# Patient Record
Sex: Male | Born: 1950
Health system: Southern US, Community
[De-identification: ages and names within clinical notes are randomized; demographics above are authoritative.]

## PROBLEM LIST (undated history)

## (undated) DIAGNOSIS — M1990 Unspecified osteoarthritis, unspecified site: Secondary | ICD-10-CM

## (undated) DIAGNOSIS — E785 Hyperlipidemia, unspecified: Secondary | ICD-10-CM

## (undated) DIAGNOSIS — T884XXA Failed or difficult intubation, initial encounter: Secondary | ICD-10-CM

## (undated) DIAGNOSIS — I2510 Atherosclerotic heart disease of native coronary artery without angina pectoris: Secondary | ICD-10-CM

## (undated) DIAGNOSIS — C801 Malignant (primary) neoplasm, unspecified: Secondary | ICD-10-CM

## (undated) DIAGNOSIS — I10 Essential (primary) hypertension: Secondary | ICD-10-CM

## (undated) DIAGNOSIS — R42 Dizziness and giddiness: Secondary | ICD-10-CM

## (undated) DIAGNOSIS — E119 Type 2 diabetes mellitus without complications: Secondary | ICD-10-CM

## (undated) DIAGNOSIS — J449 Chronic obstructive pulmonary disease, unspecified: Secondary | ICD-10-CM

## (undated) DIAGNOSIS — F4310 Post-traumatic stress disorder, unspecified: Secondary | ICD-10-CM

## (undated) DIAGNOSIS — M069 Rheumatoid arthritis, unspecified: Secondary | ICD-10-CM

## (undated) HISTORY — PX: KNEE SURGERY: SHX244

## (undated) HISTORY — PX: OTHER SURGICAL HISTORY: SHX169

## (undated) HISTORY — PX: BACK SURGERY: SHX140

## (undated) HISTORY — PX: ROTATOR CUFF REPAIR: SHX139

## (undated) HISTORY — PX: REPAIR OF PERFORATED ULCER: SHX6065

## (undated) HISTORY — DX: Unspecified osteoarthritis, unspecified site: M19.90

## (undated) HISTORY — DX: Chronic obstructive pulmonary disease, unspecified: J44.9

## (undated) HISTORY — DX: Essential (primary) hypertension: I10

## (undated) HISTORY — DX: Rheumatoid arthritis, unspecified: M06.9

## (undated) HISTORY — DX: Post-traumatic stress disorder, unspecified: F43.10

---

## 2017-08-18 ENCOUNTER — Encounter (HOSPITAL_COMMUNITY): Payer: Self-pay

## 2017-08-18 ENCOUNTER — Encounter (HOSPITAL_COMMUNITY)
Admission: RE | Admit: 2017-08-18 | Discharge: 2017-08-18 | Disposition: A | Discharge status: Home / Self Care | Payer: No Typology Code available for payment source | Source: Ambulatory Visit | Attending: Critical Care Medicine | Admitting: Critical Care Medicine

## 2017-08-18 VITALS — BP 120/82 | HR 84 | Ht 68.0 in | Wt 179.0 lb

## 2017-08-18 DIAGNOSIS — Z79899 Other long term (current) drug therapy: Secondary | ICD-10-CM | POA: Diagnosis not present

## 2017-08-18 DIAGNOSIS — J449 Chronic obstructive pulmonary disease, unspecified: Secondary | ICD-10-CM | POA: Diagnosis not present

## 2017-08-18 DIAGNOSIS — E785 Hyperlipidemia, unspecified: Secondary | ICD-10-CM | POA: Diagnosis not present

## 2017-08-18 DIAGNOSIS — Z7984 Long term (current) use of oral hypoglycemic drugs: Secondary | ICD-10-CM | POA: Insufficient documentation

## 2017-08-18 DIAGNOSIS — E119 Type 2 diabetes mellitus without complications: Secondary | ICD-10-CM | POA: Diagnosis not present

## 2017-08-18 HISTORY — DX: Malignant (primary) neoplasm, unspecified: C80.1

## 2017-08-18 HISTORY — DX: Hyperlipidemia, unspecified: E78.5

## 2017-08-18 HISTORY — DX: Type 2 diabetes mellitus without complications: E11.9

## 2017-08-18 NOTE — Progress Notes (Signed)
Cardiac/Pulmonary Rehab Medication Review by a Pharmacist  Does the patient  feel that his/her medications are working for him/her?  yes  Has the patient been experiencing any side effects to the medications prescribed?  no  Does the patient measure his/her own blood pressure or blood glucose at home?  yes   Does the patient have any problems obtaining medications due to transportation or finances?   no  Understanding of regimen: good Understanding of indications: good Potential of compliance: good  Questions asked to Determine Patient Understanding of Medication Regimen:  1. What is the name of the medication?  2. What is the medication used for?  3. When should it be taken?  4. How much should be taken?  5. How will you take it?  6. What side effects should you report?  Understanding Defined as: Excellent: All questions above are correct Good: Questions 1-4 are correct Fair: Questions 1-2 are correct  Poor: 1 or none of the above questions are correct   Pharmacist comments: Pt has PTSD and is here with service dog.  Pt gets meds from [place].  Pt does not c/o side effects.  Pt states he checks his blood sugar regularly but his BP checks are sparse and not regular.  Recommended he checks BP regularly.    Hart Robinsons A 08/18/2017 2:37 PM

## 2017-08-18 NOTE — Progress Notes (Signed)
Daily Session Note  Patient Details  Name: Bryan Kent MRN: [id number] Date of Birth: 06-02-1950 Referring Provider:     CARDIAC REHAB PHASE II ORIENTATION from 08/18/2017 in [facility]  Referring Provider  Dr. Rosaria Ferries      Encounter Date: 08/18/2017  Check In:   Capillary Blood Glucose: No results found for this or any previous visit (from the past 24 hour(s)).    Social History   Tobacco Use  Smoking Status Not on file    Goals Met:  Independence with exercise equipment Exercise tolerated well No report of cardiac concerns or symptoms Strength training completed today  Goals Unmet:  Not Applicable  Comments: Check out: 1500   Dr. Sinda Du is Medical Director for [facility].

## 2017-08-18 NOTE — Progress Notes (Signed)
Pulmonary Individual Treatment Plan  Patient Details  Name: Bryan Kent MRN: [id number] Date of Birth: 20-Jan-1951 Referring Provider:     CARDIAC REHAB PHASE II ORIENTATION from 08/18/2017 in [facility]  Referring Provider  Dr. Rosaria Ferries      Initial Encounter Date:    CARDIAC REHAB PHASE II ORIENTATION from 08/18/2017 in [facility]  Date  08/18/17  Referring Provider  Dr. Rosaria Ferries      Visit Diagnosis: Chronic obstructive pulmonary disease, unspecified COPD type ([place])  Patient's Home Medications on Admission:   Current Outpatient Medications:  .  folic acid (FOLVITE) 1 MG tablet, Take 1 mg by mouth daily. , Disp: , Rfl:  .  hydroxychloroquine (PLAQUENIL) 200 MG tablet, Take 200 mg by mouth daily., Disp: , Rfl:  .  methotrexate (RHEUMATREX) 2.5 MG tablet, Take 8 tablets by mouth every Saturday., Disp: , Rfl:  .  albuterol (ACCUNEB) 0.63 MG/3ML nebulizer solution, Inhale 1 puff into the lungs 2 (two) times daily as needed., Disp: , Rfl:  .  glipiZIDE (GLUCOTROL) 10 MG tablet, Take 10 mg by mouth 2 (two) times daily before a meal. , Disp: , Rfl:  .  metFORMIN (GLUCOPHAGE) 1000 MG tablet, Take 1,000 mg by mouth 2 (two) times daily with a meal., Disp: , Rfl:  .  omeprazole (PRILOSEC) 20 MG capsule, Take 20 mg by mouth daily., Disp: , Rfl:  .  pioglitazone (ACTOS) 30 MG tablet, Take 30 mg by mouth daily. , Disp: , Rfl:  .  rosuvastatin (CRESTOR) 40 MG tablet, Take 40 mg by mouth daily. , Disp: , Rfl:   Past Medical History: Past Medical History:  Diagnosis Date  . Cancer ([place])   . Diabetes mellitus without complication ([place])   . Hyperlipidemia     Tobacco Use: Social History   Tobacco Use  Smoking Status Not on file    Labs: Recent Review Flowsheet Data    There is no flowsheet data to display.      Capillary Blood Glucose: No results found for: GLUCAP   Pulmonary Assessment Scores: Pulmonary Assessment Scores    Row Name  08/18/17 1625         ADL UCSD   ADL Phase  Entry     SOB Score total  66     Rest  0     Walk  9     Stairs  5     Bath  2     Dress  2     Shop  4       CAT Score   CAT Score  32       mMRC Score   mMRC Score  4        Pulmonary Function Assessment: Pulmonary Function Assessment - 08/18/17 1627      Pulmonary Function Tests   FVC%  70 %    FEV1%  47 %    FEV1/FVC Ratio  46    RV%  175 %    DLCO%  55 %      Initial Spirometry Results   FVC%  70 %    FEV1%  47 %    FEV1/FVC Ratio  46      Post Bronchodilator Spirometry Results   FVC%  83 %    FEV1%  53 %    FEV1/FVC Ratio  44      Breath   Bilateral Breath Sounds  Clear    Shortness of Breath  Yes       Exercise Target Goals: Date: 08/18/17  Exercise Program Goal: Individual exercise prescription set using results from initial 6 min walk test and THRR while considering  patient's activity barriers and safety.   Exercise Prescription Goal: Initial exercise prescription builds to 30-45 minutes a day of aerobic activity, 2-3 days per week.  Home exercise guidelines will be given to patient during program as part of exercise prescription that the participant will acknowledge.  Activity Barriers & Risk Stratification: Activity Barriers & Cardiac Risk Stratification - 08/18/17 1442      Activity Barriers & Cardiac Risk Stratification   Activity Barriers  Other (comment);Back Problems;Shortness of Breath;History of Falls    Comments  Service dog for PTSD    Cardiac Risk Stratification  Moderate       6 Minute Walk: 6 Minute Walk    Row Name 08/18/17 1441         6 Minute Walk   Phase  Initial     Distance  1300 feet     Distance % Change  0 %     Distance Feet Change  0 ft     Walk Time  6 minutes     # of Rest Breaks  0     MPH  2.46     METS  2.88     RPE  13     Perceived Dyspnea   11     VO2 Peak  10.25     Symptoms  No     Resting HR  84 bpm     Resting BP  120/82     Resting  Oxygen Saturation   95 %     Exercise Oxygen Saturation  during 6 min walk  93 %     Max Ex. HR  115 bpm     Max Ex. BP  144/74     2 Minute Post BP  124/74        Oxygen Initial Assessment: Oxygen Initial Assessment - 08/18/17 1615      Home Oxygen   Home Oxygen Device  None    Sleep Oxygen Prescription  None    Home Exercise Oxygen Prescription  None    Home at Rest Exercise Oxygen Prescription  None    Compliance with Home Oxygen Use  -- None needed      Initial 6 min Walk   Oxygen Used  None      Program Oxygen Prescription   Program Oxygen Prescription  None       Oxygen Re-Evaluation:   Oxygen Discharge (Final Oxygen Re-Evaluation):   Initial Exercise Prescription: Initial Exercise Prescription - 08/18/17 1400      Date of Initial Exercise RX and Referring Provider   Date  08/18/17    Referring Provider  Dr. Rosaria Ferries      NuStep   Level  1    SPM  70    Minutes  15    METs  1.7      Arm Ergometer   Level  2    Watts  25    RPM  25    Minutes  20    METs  2.9      Prescription Details   Frequency (times per week)  3    Duration  Progress to 30 minutes of continuous aerobic without signs/symptoms of physical distress      Intensity   THRR 40-80% of Max Heartrate  [id number]  Ratings of Perceived Exertion  11-13    Perceived Dyspnea  0-4      Progression   Progression  Continue progressive overload as per policy without signs/symptoms or physical distress.      Resistance Training   Training Prescription  Yes    Weight  1    Reps  10-15       Perform Capillary Blood Glucose checks as needed.  Exercise Prescription Changes:   Exercise Comments:   Exercise Goals and Review:  Exercise Goals    Row Name 08/18/17 1444             Exercise Goals   Increase Physical Activity  Yes       Intervention  Provide advice, education, support and counseling about physical activity/exercise needs.;Develop an individualized exercise  prescription for aerobic and resistive training based on initial evaluation findings, risk stratification, comorbidities and participant's personal goals.       Expected Outcomes  Short Term: Attend rehab on a regular basis to increase amount of physical activity.       Increase Strength and Stamina  Yes       Intervention  Provide advice, education, support and counseling about physical activity/exercise needs.;Develop an individualized exercise prescription for aerobic and resistive training based on initial evaluation findings, risk stratification, comorbidities and participant's personal goals.       Expected Outcomes  Short Term: Increase workloads from initial exercise prescription for resistance, speed, and METs.       Able to understand and use rate of perceived exertion (RPE) scale  Yes       Intervention  Provide education and explanation on how to use RPE scale       Expected Outcomes  Short Term: Able to use RPE daily in rehab to express subjective intensity level;Long Term:  Able to use RPE to guide intensity level when exercising independently       Able to understand and use Dyspnea scale  Yes       Intervention  Provide education and explanation on how to use Dyspnea scale       Expected Outcomes  Short Term: Able to use Dyspnea scale daily in rehab to express subjective sense of shortness of breath during exertion;Long Term: Able to use Dyspnea scale to guide intensity level when exercising independently       Knowledge and understanding of Target Heart Rate Range (THRR)  Yes       Intervention  Provide education and explanation of THRR including how the numbers were predicted and where they are located for reference       Expected Outcomes  Short Term: Able to state/look up THRR;Short Term: Able to use daily as guideline for intensity in rehab;Long Term: Able to use THRR to govern intensity when exercising independently       Able to check pulse independently  Yes       Intervention   Provide education and demonstration on how to check pulse in carotid and radial arteries.;Review the importance of being able to check your own pulse for safety during independent exercise       Expected Outcomes  Short Term: Able to explain why pulse checking is important during independent exercise;Long Term: Able to check pulse independently and accurately       Understanding of Exercise Prescription  Yes       Intervention  Provide education, explanation, and written materials on patient's individual exercise prescription  Expected Outcomes  Short Term: Able to explain program exercise prescription;Long Term: Able to explain home exercise prescription to exercise independently          Exercise Goals Re-Evaluation :   Discharge Exercise Prescription (Final Exercise Prescription Changes):   Nutrition:  Target Goals: Understanding of nutrition guidelines, daily intake of sodium 1500mg , cholesterol 200mg , calories 30% from fat and 7% or less from saturated fats, daily to have 5 or more servings of fruits and vegetables.  Biometrics: Pre Biometrics - 08/18/17 1445      Pre Biometrics   Height  5\' 8"  (1.727 m)    Weight  179 lb (81.2 kg)    Waist Circumference  41 inches    Hip Circumference  38 inches    Waist to Hip Ratio  1.08 %    BMI (Calculated)  27.22    Triceps Skinfold  8 mm    % Body Fat  25.3 %    Grip Strength  53.46 kg    Flexibility  0 in    Single Leg Stand  31 seconds        Nutrition Therapy Plan and Nutrition Goals: Nutrition Therapy & Goals - 08/18/17 1616      Personal Nutrition Goals   Personal Goal #2  Eats a regular diet.     Additional Goals?  No       Nutrition Assessments: Nutrition Assessments - 08/18/17 1617      MEDFICTS Scores   Pre Score  7       Nutrition Goals Re-Evaluation:   Nutrition Goals Discharge (Final Nutrition Goals Re-Evaluation):   Psychosocial: Target Goals: Acknowledge presence or absence of significant  depression and/or stress, maximize coping skills, provide positive support system. Participant is able to verbalize types and ability to use techniques and skills needed for reducing stress and depression.  Initial Review & Psychosocial Screening: Initial Psych Review & Screening - 08/18/17 1620      Initial Review   Current issues with  Current Anxiety/Panic Patient has PTSD      Family Dynamics   Good Support System?  Yes      Barriers   Psychosocial barriers to participate in program  Psychosocial barriers identified (see note)      Screening Interventions   Interventions  Encouraged to exercise    Expected Outcomes  Short Term goal: Utilizing psychosocial counselor, staff and physician to assist with identification of specific Stressors or current issues interfering with healing process. Setting desired goal for each stressor or current issue identified.;Long Term Goal: Stressors or current issues are controlled or eliminated. Patient has counselors at the [place]. Patient refused to do our [place] [facility] [place].        Quality of Life Scores: Quality of Life - 08/18/17 1446      Quality of Life Scores   Health/Function Pre  --  (Significant)  Patient refused to answer QOL sheet      Scores of 19 and below usually indicate a poorer quality of life in these areas.  A difference of  2-3 points is a clinically meaningful difference.  A difference of 2-3 points in the total score of the Quality of Life Index has been associated with significant improvement in overall quality of life, self-image, physical symptoms, and general health in studies assessing change in quality of life.   PHQ-9: Recent Review Flowsheet Data    Depression screen [facility] 2/9 08/18/2017   Decreased Interest 0   Down, Depressed,  Hopeless 0   PHQ - 2 Score 0   Altered sleeping 0   Tired, decreased energy 0   Change in appetite 0   Feeling bad or failure about yourself  0   Trouble concentrating 0   Moving slowly  or fidgety/restless 0   Suicidal thoughts 0   PHQ-9 Score 0   Difficult doing work/chores Not difficult at all     Interpretation of Total Score  Total Score Depression Severity:  1-4 = Minimal depression, 5-9 = Mild depression, 10-14 = Moderate depression, 15-19 = Moderately severe depression, 20-27 = Severe depression   Psychosocial Evaluation and Intervention: Psychosocial Evaluation - 08/18/17 1623      Psychosocial Evaluation & Interventions   Interventions  Encouraged to exercise with the program and follow exercise prescription    Comments  Patient has PTSD and has a service dog to aid in that.     Continue Psychosocial Services   Follow up required by staff       Psychosocial Re-Evaluation:   Psychosocial Discharge (Final Psychosocial Re-Evaluation):    Education: Education Goals: Education classes will be provided on a weekly basis, covering required topics. Participant will state understanding/return demonstration of topics presented.  Learning Barriers/Preferences: Learning Barriers/Preferences - 08/18/17 1456      Learning Barriers/Preferences   Learning Barriers  None    Learning Preferences  Skilled Demonstration;Group Instruction;Individual Instruction       Education Topics: How Lungs Work and Diseases: - Discuss the anatomy of the lungs and diseases that can affect the lungs, such as COPD.   Exercise: -Discuss the importance of exercise, FITT principles of exercise, normal and abnormal responses to exercise, and how to exercise safely.   Environmental Irritants: -Discuss types of environmental irritants and how to limit exposure to environmental irritants.   Meds/Inhalers and oxygen: - Discuss respiratory medications, definition of an inhaler and oxygen, and the proper way to use an inhaler and oxygen.   Energy Saving Techniques: - Discuss methods to conserve energy and decrease shortness of breath when performing activities of daily living.     Bronchial Hygiene / Breathing Techniques: - Discuss breathing mechanics, pursed-lip breathing technique,  proper posture, effective ways to clear airways, and other functional breathing techniques   Cleaning Equipment: - Provides group verbal and written instruction about the health risks of elevated stress, cause of high stress, and healthy ways to reduce stress.   Nutrition I: Fats: - Discuss the types of cholesterol, what cholesterol does to the body, and how cholesterol levels can be controlled.   Nutrition II: Labels: -Discuss the different components of food labels and how to read food labels.   Respiratory Infections: - Discuss the signs and symptoms of respiratory infections, ways to prevent respiratory infections, and the importance of seeking medical treatment when having a respiratory infection.   Stress I: Signs and Symptoms: - Discuss the causes of stress, how stress may lead to anxiety and depression, and ways to limit stress.   Stress II: Relaxation: -Discuss relaxation techniques to limit stress.   Oxygen for Home/Travel: - Discuss how to prepare for travel when on oxygen and proper ways to transport and store oxygen to ensure safety.   Knowledge Questionnaire Score: Knowledge Questionnaire Score - 08/18/17 1611      Knowledge Questionnaire Score   Pre Score  13/18       Core Components/Risk Factors/Patient Goals at Admission: Personal Goals and Risk Factors at Admission - 08/18/17 1617  Core Components/Risk Factors/Patient Goals on Admission    Weight Management  Weight Maintenance    Improve shortness of breath with ADL's  Yes    Intervention  Provide education, individualized exercise plan and daily activity instruction to help decrease symptoms of SOB with activities of daily living.    Expected Outcomes  Short Term: Improve cardiorespiratory fitness to achieve a reduction of symptoms when performing ADLs;Long Term: Be able to perform more  ADLs without symptoms or delay the onset of symptoms    Personal Goal Other  Yes    Personal Goal  Have more endurance, Gain muscle tone    Intervention  Attend PR 2 x week and supplement at home 3 x week.    Expected Outcomes  Reach personal goal.        Core Components/Risk Factors/Patient Goals Review:    Core Components/Risk Factors/Patient Goals at Discharge (Final Review):    ITP Comments: ITP Comments    Row Name 08/18/17 1613           ITP Comments  Mr. Dewan is a veteran with COPD. He is being sent to [place] by the [place]. He has a service dog due to his PTSD. He will be able to bring his dog with him. The dog is trained and sit by Mr. Tetrault.           Comments: Patient arrived for 1st visit/orientation/education at 1230. Patient was referred to PR by Dr. Rosaria Ferries at the [facility]. Due to COPD (J44.9). During orientation advised patient on arrival and appointment times what to wear, what to do before, during and after exercise. Reviewed attendance and class policy. Talked about inclement weather and class consultation policy. Pt is scheduled to return Cardiac Rehab on 08/23/17 at 1330. Pt was advised to come to class 15 minutes before class starts. Patient was also given instructions on meeting with the dietician and attending the Family Structure classes. Discussed RPE/Dpysnea scales. Discussed initial THR and how to find their radial and/or carotid pulse. Discussed the initial exercise prescription and how this effects their progress. Pt is eager to get started. Patient participated in warm up stretches followed by light weights and resistance bands. Patient was able to complete 6 minute walk test. Patient did not c/o pain. Patient was measured for the equipment. Discussed equipment safety with patient. Took patient pre-anthropometric measurements. Patient finished visit at 1500.

## 2017-08-23 ENCOUNTER — Encounter (HOSPITAL_COMMUNITY): Payer: No Typology Code available for payment source

## 2017-08-25 ENCOUNTER — Encounter (HOSPITAL_COMMUNITY): Payer: No Typology Code available for payment source

## 2017-08-30 ENCOUNTER — Encounter (HOSPITAL_COMMUNITY): Payer: No Typology Code available for payment source

## 2017-09-01 ENCOUNTER — Encounter (HOSPITAL_COMMUNITY): Payer: No Typology Code available for payment source

## 2017-09-06 ENCOUNTER — Encounter (HOSPITAL_COMMUNITY): Payer: No Typology Code available for payment source

## 2017-09-08 ENCOUNTER — Encounter (HOSPITAL_COMMUNITY): Payer: No Typology Code available for payment source

## 2017-09-13 ENCOUNTER — Encounter (HOSPITAL_COMMUNITY): Payer: No Typology Code available for payment source

## 2017-09-15 ENCOUNTER — Encounter (HOSPITAL_COMMUNITY): Payer: No Typology Code available for payment source

## 2017-09-20 ENCOUNTER — Encounter (HOSPITAL_COMMUNITY): Payer: Non-veteran care

## 2017-09-22 ENCOUNTER — Encounter (HOSPITAL_COMMUNITY): Payer: Non-veteran care

## 2017-09-27 ENCOUNTER — Encounter (HOSPITAL_COMMUNITY): Payer: Non-veteran care

## 2017-09-29 ENCOUNTER — Encounter (HOSPITAL_COMMUNITY): Payer: Non-veteran care

## 2017-10-04 ENCOUNTER — Encounter (HOSPITAL_COMMUNITY): Payer: Non-veteran care

## 2017-10-06 ENCOUNTER — Encounter (HOSPITAL_COMMUNITY): Payer: Non-veteran care

## 2017-10-11 ENCOUNTER — Encounter (HOSPITAL_COMMUNITY): Payer: Non-veteran care

## 2017-10-13 ENCOUNTER — Encounter (HOSPITAL_COMMUNITY): Payer: Non-veteran care

## 2017-10-18 ENCOUNTER — Encounter (HOSPITAL_COMMUNITY): Payer: No Typology Code available for payment source

## 2017-10-20 ENCOUNTER — Encounter (HOSPITAL_COMMUNITY): Payer: No Typology Code available for payment source

## 2017-10-25 ENCOUNTER — Encounter (HOSPITAL_COMMUNITY): Payer: No Typology Code available for payment source

## 2017-10-27 ENCOUNTER — Encounter (HOSPITAL_COMMUNITY): Payer: No Typology Code available for payment source

## 2017-11-01 ENCOUNTER — Encounter (HOSPITAL_COMMUNITY): Payer: No Typology Code available for payment source

## 2017-11-03 ENCOUNTER — Encounter (HOSPITAL_COMMUNITY): Payer: No Typology Code available for payment source

## 2017-11-08 ENCOUNTER — Encounter (HOSPITAL_COMMUNITY): Payer: No Typology Code available for payment source

## 2017-11-10 ENCOUNTER — Encounter (HOSPITAL_COMMUNITY): Payer: No Typology Code available for payment source

## 2017-11-15 ENCOUNTER — Encounter (HOSPITAL_COMMUNITY): Payer: No Typology Code available for payment source

## 2017-11-17 ENCOUNTER — Encounter (HOSPITAL_COMMUNITY): Payer: No Typology Code available for payment source

## 2017-11-22 ENCOUNTER — Encounter (HOSPITAL_COMMUNITY): Payer: No Typology Code available for payment source

## 2017-11-24 ENCOUNTER — Encounter (HOSPITAL_COMMUNITY): Payer: No Typology Code available for payment source

## 2017-11-29 ENCOUNTER — Encounter (HOSPITAL_COMMUNITY): Payer: No Typology Code available for payment source

## 2017-12-01 ENCOUNTER — Encounter (HOSPITAL_COMMUNITY): Payer: No Typology Code available for payment source

## 2017-12-06 ENCOUNTER — Encounter (HOSPITAL_COMMUNITY): Payer: No Typology Code available for payment source

## 2017-12-08 ENCOUNTER — Encounter (HOSPITAL_COMMUNITY): Payer: No Typology Code available for payment source

## 2017-12-13 ENCOUNTER — Encounter (HOSPITAL_COMMUNITY): Payer: No Typology Code available for payment source

## 2017-12-15 ENCOUNTER — Encounter (HOSPITAL_COMMUNITY): Payer: No Typology Code available for payment source

## 2017-12-20 ENCOUNTER — Encounter (HOSPITAL_COMMUNITY): Payer: No Typology Code available for payment source

## 2017-12-22 ENCOUNTER — Encounter (HOSPITAL_COMMUNITY): Payer: No Typology Code available for payment source

## 2018-12-07 ENCOUNTER — Ambulatory Visit (INDEPENDENT_AMBULATORY_CARE_PROVIDER_SITE_OTHER): Payer: No Typology Code available for payment source

## 2018-12-07 ENCOUNTER — Other Ambulatory Visit: Payer: Self-pay | Admitting: *Deleted

## 2018-12-07 ENCOUNTER — Ambulatory Visit (INDEPENDENT_AMBULATORY_CARE_PROVIDER_SITE_OTHER): Payer: No Typology Code available for payment source | Admitting: Orthopaedic Surgery

## 2018-12-07 ENCOUNTER — Encounter: Payer: Self-pay | Admitting: Orthopaedic Surgery

## 2018-12-07 ENCOUNTER — Other Ambulatory Visit: Payer: Self-pay

## 2018-12-07 VITALS — Ht 68.0 in | Wt 180.0 lb

## 2018-12-07 DIAGNOSIS — G8929 Other chronic pain: Secondary | ICD-10-CM | POA: Diagnosis not present

## 2018-12-07 DIAGNOSIS — M25511 Pain in right shoulder: Secondary | ICD-10-CM

## 2018-12-07 NOTE — Progress Notes (Signed)
Office Visit Note   Patient: Bryan Kent           Date of Birth: 1950/10/14           MRN: [id number] Visit Date: 12/07/2018              Requested by: Kirke Corin, [place],  [place] [address] PCP: Patient, No Pcp Per   Assessment & Plan: Visit Diagnoses:  1. 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  Will order a thin slice CT arthrogram to evaluate the integrity of the cuff and potentially prepare for reverse shoulder arthroplasty. long discussion with Mr. Snapp regarding potential diagnosis and treatment options.  He is concerned about cortisone injection because of his diabetes.  In the past he has had elevation of his blood sugar with cortisone.  He is sponsored by the [place]. prior rotator cuff tear repair in 2014 in [place]. Office visit over 45 minutes 50% of the time in counseling  Follow-Up Instructions: Return After a thin slice CT arthrogram right shoulder.   Orders:  Orders Placed This Encounter  Procedures  . XR Shoulder Right   No orders of the defined types were placed in this encounter.     Procedures: No procedures performed   Clinical Data: No additional findings.   Subjective: Chief Complaint  Patient presents with  . Right Shoulder - Pain  Patient presents today for right shoulder pain. His shoulder has been hurting for two weeks with no known injury that he is aware of. His pain is located within the joint and radiates down into his elbow. He has limited range of motion. He does have some weakness in his arm. He is only taking aspirin as needed for pain. He takes prednisone for arthritis flare ups. He is right hand dominant. He has a history of a rotator cuff surgery repair in 2014. Mr. Maclellan is sponsored by the [place].  He is recently been evaluated and referred to [place] for further evaluation of his shoulder pain.  He did have rotator cuff surgery in [place] in 2014 and  did well up until the last 4 to 6 months when he would experience insidious onset of pain.  Now he is having difficulty raising his arm over his head.  Has had trouble sleeping on that side.  He is having quite a bit of pain as well as weakness.  He does not have any issues with range of motion of the cervical spine.  He does have a history of COPD and uses inhalers.  He is a type II diabetic and has had issues with elevated blood glucose with prior cortisone injections.  He also has a service dog that accompanies him  HPI  Review of Systems  Constitutional: Negative for fatigue.  HENT: Negative for ear pain.   Eyes: Negative for pain.  Respiratory: Negative for shortness of breath.   Cardiovascular: Negative for leg swelling.  Gastrointestinal: Negative for constipation and diarrhea.  Endocrine: Negative for cold intolerance and heat intolerance.  Genitourinary: Negative for difficulty urinating.  Musculoskeletal: Negative for joint swelling.  Skin: Negative for rash.  Allergic/Immunologic: Positive for food allergies.  Neurological: Positive for weakness.  Hematological: Does not bruise/bleed easily.  Psychiatric/Behavioral: Positive for sleep disturbance.     Objective: Vital Signs: Ht 5\' 8"  (1.727 m)   Wt 180 lb (81.6 kg)   BMI 27.37 kg/m   Physical Exam  Constitutional:      Appearance: He is well-developed.  Eyes:     Pupils: Pupils are equal, round, and reactive to light.  Pulmonary:     Effort: Pulmonary effort is normal.  Skin:    General: Skin is warm and dry.  Neurological:     Mental Status: He is alert and oriented to person, place, and time.  Psychiatric:        Behavior: Behavior normal.     Ortho Exam no shortness of breath or chest pain.  Had taken an inhaler prior to coming to the office. With passive motion he still lacked about 40 degrees of full overhead motion.  I could abduct about 90 degrees at which point he had some pain.  No crepitation.  Atrophy  diffusely about the right shoulder compared to the left.  Biceps appeared to be intact.  Skin intact.  Some discomfort along the anterior and lateral subacromial region.  Considerable weakness with external rotation.  Good strength with internal rotation.  No pain with range of motion of cervical spine .positive empty can and impingement testing.  Good grip and release  Specialty Comments:  No specialty comments available.  Imaging: No results found.   PMFS History: Patient Active Problem List   Diagnosis Date Noted  . Pain in right shoulder 12/07/2018   Past Medical History:  Diagnosis Date  . Cancer ([place])   . Diabetes mellitus without complication ([place])   . Hyperlipidemia     Family History  Problem Relation Age of Onset  . Heart disease Father     History reviewed. No pertinent surgical history. Social History   Occupational History  . Occupation: Retired  Tobacco Use  . Smoking status: Never Smoker  . Smokeless tobacco: Never Used  Substance and Sexual Activity  . Alcohol use: Not Currently  . Drug use: Never  . Sexual activity: Not on file

## 2018-12-12 ENCOUNTER — Telehealth: Payer: Self-pay | Admitting: Orthopaedic Surgery

## 2018-12-12 NOTE — Telephone Encounter (Signed)
12/07/18 ov note faxed to Lorel [facility] [phone], ph [phone]

## 2018-12-19 ENCOUNTER — Telehealth: Payer: Self-pay | Admitting: Orthopaedic Surgery

## 2018-12-19 NOTE — Telephone Encounter (Signed)
Patient called stating his MRI at [facility] was cancelled due to not accepting VA and only having Medicare Part A.  Patient is requesting a return call ASAP to discuss what to do next.

## 2018-12-19 NOTE — Telephone Encounter (Signed)
Can you help? Im not sure what to tell the patient.

## 2018-12-20 ENCOUNTER — Telehealth: Payer: Self-pay | Admitting: Orthopaedic Surgery

## 2018-12-20 ENCOUNTER — Other Ambulatory Visit: Payer: Self-pay | Admitting: Orthopaedic Surgery

## 2018-12-20 DIAGNOSIS — G8929 Other chronic pain: Secondary | ICD-10-CM

## 2018-12-20 NOTE — Telephone Encounter (Signed)
Spoke with patient. He has an appointment on Sept 21st at [place] for his CT scan at [facility]. I have entered his lab orders and he knows to get those done prior to his scan.

## 2018-12-20 NOTE — Telephone Encounter (Signed)
Patient left a voicemail stating he left a message yesterday regarding his MRI that was cancelled.

## 2018-12-22 ENCOUNTER — Other Ambulatory Visit (HOSPITAL_COMMUNITY)
Admission: RE | Admit: 2018-12-22 | Discharge: 2018-12-22 | Disposition: A | Discharge status: Home / Self Care | Payer: No Typology Code available for payment source | Source: Ambulatory Visit | Attending: Orthopaedic Surgery | Admitting: Orthopaedic Surgery

## 2018-12-22 DIAGNOSIS — Z01812 Encounter for preprocedural laboratory examination: Secondary | ICD-10-CM | POA: Diagnosis present

## 2018-12-22 LAB — CREATININE, SERUM
Creatinine, Ser: 0.95 mg/dL (ref 0.61–1.24)
GFR calc Af Amer: >60 mL/min (ref >60)
GFR calc non Af Amer: >60 mL/min (ref >60)

## 2018-12-22 LAB — BRAIN NATRIURETIC PEPTIDE: B Natriuretic Peptide: 44 pg/mL (ref 0.0–100.0)

## 2019-01-08 ENCOUNTER — Other Ambulatory Visit: Payer: Self-pay

## 2019-01-08 ENCOUNTER — Ambulatory Visit (HOSPITAL_COMMUNITY)
Admission: RE | Admit: 2019-01-08 | Discharge: 2019-01-08 | Disposition: A | Discharge status: Home / Self Care | Payer: No Typology Code available for payment source | Source: Ambulatory Visit | Attending: Orthopaedic Surgery | Admitting: Orthopaedic Surgery

## 2019-01-08 DIAGNOSIS — G8929 Other chronic pain: Secondary | ICD-10-CM

## 2019-01-08 DIAGNOSIS — M25511 Pain in right shoulder: Secondary | ICD-10-CM | POA: Insufficient documentation

## 2019-01-08 DIAGNOSIS — M62511 Muscle wasting and atrophy, not elsewhere classified, right shoulder: Secondary | ICD-10-CM | POA: Diagnosis not present

## 2019-01-08 DIAGNOSIS — M19011 Primary osteoarthritis, right shoulder: Secondary | ICD-10-CM | POA: Insufficient documentation

## 2019-01-08 DIAGNOSIS — M75121 Complete rotator cuff tear or rupture of right shoulder, not specified as traumatic: Secondary | ICD-10-CM | POA: Diagnosis not present

## 2019-01-08 IMAGING — RF DG FLUORO GUIDE NDL PLC/BX
1 series · 1 of 1 positions shown · non-contrast
Comparison: none

CLINICAL DATA: Chronic RIGHT shoulder pain, prior rotator cuff
repair

[Series 1: cp_standard · 0.19mm/px · 1 of 1 slices shown]
[im 1/1]
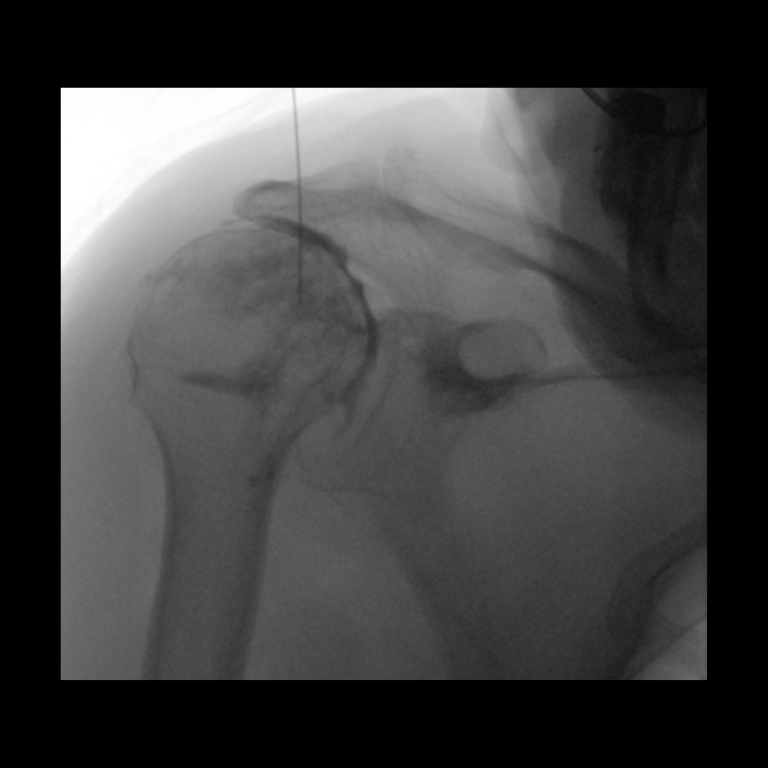

[1 of 1 positions shown; findings below may reference images not displayed]

EXAM:
RIGHT SHOULDER INJECTION UNDER FLUOROSCOPY

FLUOROSCOPY TIME:  Fluoroscopy Time:  1 minutes 12 seconds

Radiation Exposure Index (if provided by the fluoroscopic device):
4.1 mGy

Number of Acquired Spot Images: 1

PROCEDURE:
Procedure, risks, benefits and alternatives explained to the
patient.

Patient's questions answered.

Written informed consent obtained.

Timeout protocol followed.

RIGHT shoulder joint localized by fluoroscopy.

Skin prepped and draped in usual sterile fashion.

Skin and soft tissues anesthetized with 4 mL of 1% lidocaine.

Under fluoroscopic guidance, 22 gauge spinal needle was advanced
into RIGHT shoulder joint.

10 mL of a solution consisting of 5 mL Isovue 310 mL sterile saline
injected into RIGHT shoulder joint without difficulty.

Procedure tolerated well by patient without immediate complication.
IMPRESSION: Technically successful RIGHT shoulder injection under fluoroscopy.

## 2019-01-08 IMAGING — CT CT SHOULDER*R* W/CM
2 of 10 series · 7 of 34 positions shown, 8 images · non-contrast
Comparison: Plain films right shoulder [DATE]. Image from
contrast injection also reviewed.

CLINICAL DATA: Chronic right shoulder pain. History of prior
rotator cuff tear.

EXAM:
CT ARTHROGRAPHY OF THE RIGHT SHOULDER
TECHNIQUE: Multidetector CT imaging was performed following the standard
protocol after injection of dilute contrast into the joint.

[Series 10: ax aber thin bone · axial · 0.57mm/px · z∈[+185,+279]mm · 2 of 501 slices shown, 3 images]
[im 167/501  soft-tissue]
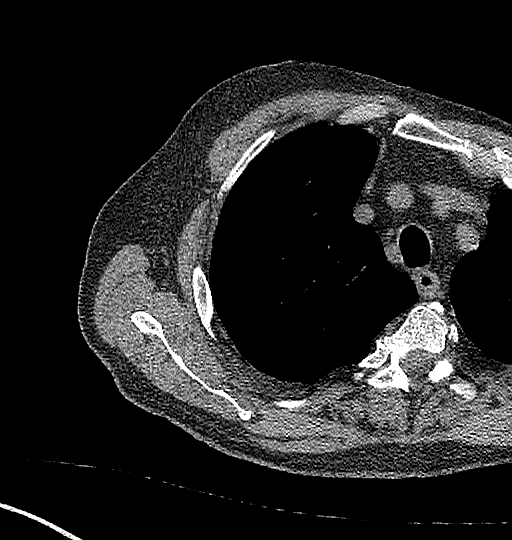
[im 167/501  bone]
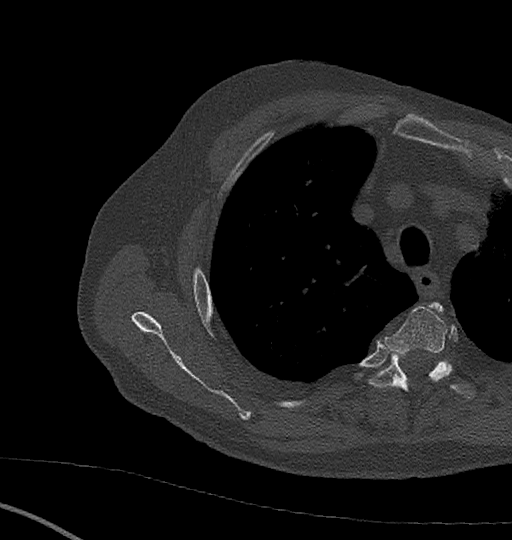
[im 334/501  bone]
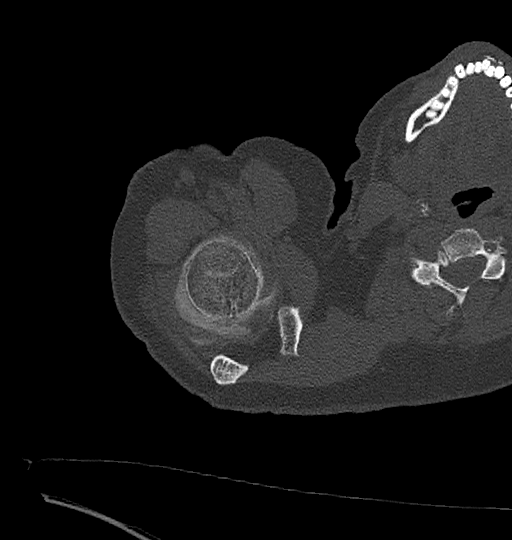

[Series 25: aber sag bone · sagittal · 0.60mm/px · 5 of 467 slices shown]
[im 66/467  bone]
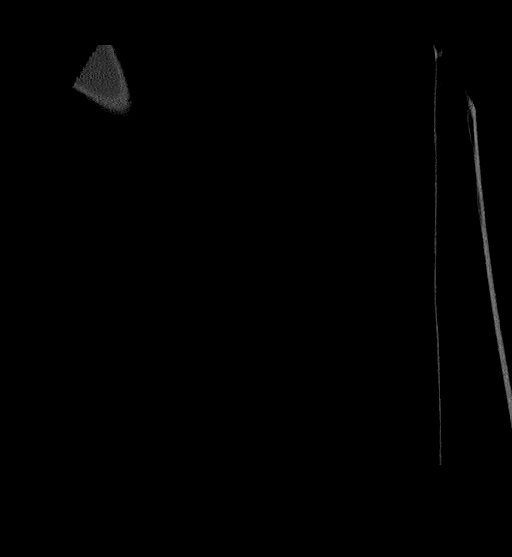
[im 69/467  soft-tissue]
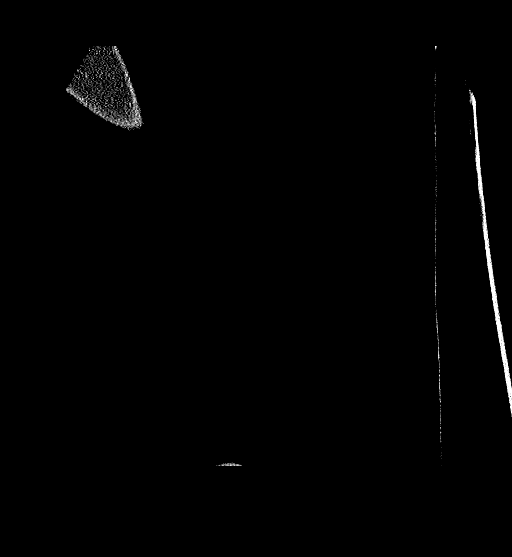
[im 131/467  bone]
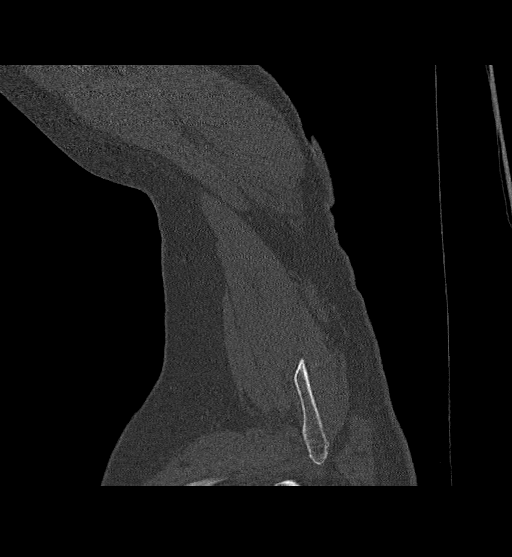
[im 196/467  bone]
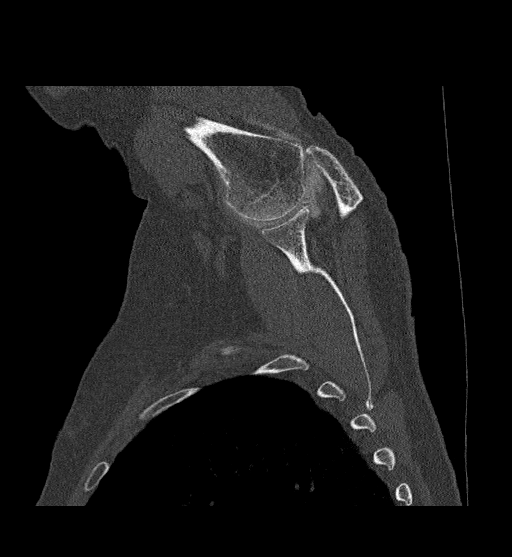
[im 262/467  bone]
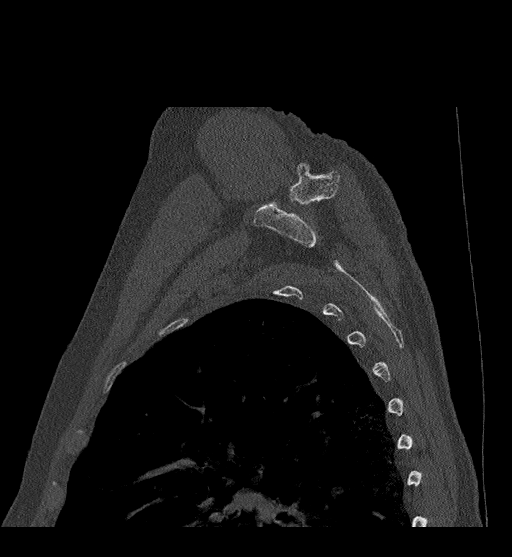

[7 of 34 positions shown; findings below may reference images not displayed]

FINDINGS: Rotator cuff: Anchor related to prior rotator cuff repair is noted.
The patient has complete supraspinatus and near-complete
infraspinatus tendon tears with retraction to the glenoid, 4-5 cm.
Only a small band of the posterior most infraspinatus tendon is
intact. The subscapularis is intact.

Muscles: Mild supraspinatus, infraspinatus and subscapularis atrophy
is most notable in the supraspinatus.

Biceps long head: Somewhat difficult to visualize. The
intra-articular segment appears attenuated but intact.

Acromioclavicular Joint: Moderate osteoarthritis. Type 1 acromion.
There is some subacromial spurring. Contrast extravasates into the
subacromial/subdeltoid bursa.

Glenohumeral Joint: Mild degenerative change. With some cartilage
thinning noted

Labrum: The superior labrum is blunted. Contrast undermines the
posterior, superior labrum consistent with a tear.

Bones:  No fracture or worrisome lesion.

Other: Imaged lung parenchyma is emphysematous but clear.
IMPRESSION: Status post rotator cuff repair. The patient has complete
supraspinatus and near-complete infraspinatus tendon tears with 4-5
cm of retraction and mild atrophy of both muscle bellies, more
notable in the supraspinatus.

The long head of biceps is difficult to visualize but appears intact
with attenuation of the intra-articular segment identified.

Moderate acromioclavicular osteoarthritis. Mild glenohumeral
osteoarthritis also noted.

Tear of the posterior, superior labrum.

## 2019-01-08 MED ORDER — IOHEXOL 300 MG/ML  SOLN
5.0000 mL | Freq: Once | INTRAMUSCULAR | Status: AC | PRN
  Administered 2019-01-08: 5 mL

## 2019-01-08 MED ORDER — POVIDONE-IODINE 10 % EX SOLN
CUTANEOUS | Status: AC
  Administered 2019-01-08: 11:00:00
  Filled 2019-01-08: qty 15

## 2019-01-08 MED ORDER — LIDOCAINE HCL (PF) 1 % IJ SOLN
INTRAMUSCULAR | Status: AC
  Administered 2019-01-08: 5 mL
  Filled 2019-01-08: qty 5

## 2019-01-08 MED ORDER — SODIUM CHLORIDE (PF) 0.9 % IJ SOLN
INTRAMUSCULAR | Status: AC
  Administered 2019-01-08: 10 mL
  Filled 2019-01-08: qty 10

## 2019-01-08 NOTE — Procedures (Signed)
Preprocedure Dx: Chronic right shoulder pain Postprocedure Dx: Chronic right shoulder pain Procedure  Fluoroscopically guided RIGHT shoulder joint injection for CT arthrogrpahy Radiologist:  Thornton Papas Anesthesia:  4 ml of 1% lidocaine Injectate:  10 ml of (5 ml Isovue-300 and 10 ml sterile saline) Fluoro time:  1 minutes 12 seconds EBL:   None Complications: None

## 2019-01-10 ENCOUNTER — Encounter: Payer: Self-pay | Admitting: Orthopaedic Surgery

## 2019-01-10 ENCOUNTER — Ambulatory Visit (INDEPENDENT_AMBULATORY_CARE_PROVIDER_SITE_OTHER): Payer: No Typology Code available for payment source | Admitting: Orthopaedic Surgery

## 2019-01-10 ENCOUNTER — Other Ambulatory Visit: Payer: Self-pay

## 2019-01-10 VITALS — Ht 68.0 in | Wt 181.0 lb

## 2019-01-10 DIAGNOSIS — G8929 Other chronic pain: Secondary | ICD-10-CM

## 2019-01-10 DIAGNOSIS — M25511 Pain in right shoulder: Secondary | ICD-10-CM | POA: Diagnosis not present

## 2019-01-10 MED ORDER — HYDROCODONE-ACETAMINOPHEN 5-325 MG PO TABS: 1.0000 | ORAL_TABLET | Freq: Every evening | ORAL | 0 refills | Status: DC | PRN

## 2019-01-10 NOTE — Addendum Note (Signed)
Addended by: Lendon Collar on: 01/10/2019 11:35 AM   Modules accepted: Orders

## 2019-01-10 NOTE — Progress Notes (Signed)
Office Visit Note   Patient: Bryan Kent           Date of Birth: [birth year]           MRN: [id number] Visit Date: 01/10/2019              Requested by: No referring provider defined for this encounter. PCP: Clinic, [place]: Visit Diagnoses:  1. Chronic right shoulder pain     Plan: Long discussion with Mr. Mastrogiacomo regarding his CT scan.  The scan did demonstrate complete tear of the infra and supraspinatus with retraction to the glenoid i.e. over 4 to 5 cm.  He does have some early arthritis of the glenohumeral joint.  I do not think he would be a candidate for rotator cuff tear repair given the atrophy and the retraction.  I do think he would be a candidate for reverse shoulder arthroplasty.  Have discussed this in some detail with him and will refer him to Dr. Marlou Sa.  Hydrocodone for pain.  Office visit over 30 minutes 50% of the time in counseling  Follow-Up Instructions: Return Will refer to Dr. Marlou Sa for consideration of a reverse shoulder arthroplasty.   Orders:  No orders of the defined types were placed in this encounter.  Meds ordered this encounter  Medications  . HYDROcodone-acetaminophen (NORCO/VICODIN) 5-325 MG tablet    Sig: Take 1 tablet by mouth at bedtime as needed for moderate pain.    Dispense:  30 tablet    Refill:  0      Procedures: No procedures performed   Clinical Data: No additional findings.   Subjective: Chief Complaint  Patient presents with  . Right Shoulder - Follow-up  Patient presents today for follow up on his right shoulder. He had a CT scan on 01/08/2019, and is here today for those results.  No change in symptoms.  Still having considerable pain and difficulty sleeping.  Also having difficulty raising his right arm over his head  HPI  Review of Systems   Objective: Vital Signs: Ht 5\' 8"  (1.727 m)   Wt 181 lb (82.1 kg)   BMI 27.52 kg/m   Physical Exam Constitutional:      Appearance: He is  well-developed.  Eyes:     Pupils: Pupils are equal, round, and reactive to light.  Pulmonary:     Effort: Pulmonary effort is normal.  Skin:    General: Skin is warm and dry.  Neurological:     Mental Status: He is alert and oriented to person, place, and time.  Psychiatric:        Behavior: Behavior normal.     Ortho Exam awake and alert.  Accompanied by his service dog.  Considerable weakness with internal and external rotation of his right shoulder.  Only has about 30 to 40 degrees of abduction and flexion actively.  I could raise his arm almost over his head but with pain.  Biceps appears to be intact.  Good grip and release.  No neck pain.  Specialty Comments:  No specialty comments available.  Imaging: No results found.   PMFS History: Patient Active Problem List   Diagnosis Date Noted  . Pain in right shoulder 12/07/2018   Past Medical History:  Diagnosis Date  . Cancer ([place])   . Diabetes mellitus without complication ([place])   . Hyperlipidemia     Family History  Problem Relation Age of Onset  . Heart disease Father  History reviewed. No pertinent surgical history. Social History   Occupational History  . Occupation: Retired  Tobacco Use  . Smoking status: Never Smoker  . Smokeless tobacco: Never Used  Substance and Sexual Activity  . Alcohol use: Not Currently  . Drug use: Never  . Sexual activity: Not on file

## 2019-01-24 ENCOUNTER — Encounter: Payer: Self-pay | Admitting: Orthopedic Surgery

## 2019-01-24 ENCOUNTER — Ambulatory Visit (INDEPENDENT_AMBULATORY_CARE_PROVIDER_SITE_OTHER): Payer: No Typology Code available for payment source | Admitting: Orthopedic Surgery

## 2019-01-24 DIAGNOSIS — M12811 Other specific arthropathies, not elsewhere classified, right shoulder: Secondary | ICD-10-CM

## 2019-01-24 NOTE — Progress Notes (Signed)
Office Visit Note   Patient: Bryan Kent           Date of Birth: Feb 15, 1951           MRN: [id number] Visit Date: 01/24/2019 Requested by: Garald Balding, [place],  [place] [address] PCP: [facility]  Subjective: Chief Complaint  Patient presents with  . Right Shoulder - Pain    HPI: Bryan Kent is a 68 y.o. male who presents to the office complaining of right shoulder pain.  Patient injured his right shoulder 2 months ago while operating a hand-held drill overhead.  He felt a pop and a sharp stabbing pain.  Since then he notes severe pain and weakness of the right shoulder.  Pain is worse with reaching out and with laying on the right side.  He denies any neck pain, radicular symptoms, numbness tingling.  He has a history of a previous right shoulder scope in 2012 with rotator cuff tear.  He works as a Geologist, engineering.  He has been taking hydrocodone and aspirin for pain.  His hobbies include hunting and long gun shooting.  He denies smoking.              ROS:  All systems reviewed are negative as they relate to the chief complaint within the history of present illness.  Patient denies fevers or chills.  Assessment & Plan: Visit Diagnoses:  1. Rotator cuff arthropathy of right shoulder     Plan: Patient is a 68 year old male who presents following right shoulder injury 2 months ago.  He has significant weakness and decreased active range of motion of the right shoulder on exam.  Impression is rotator cuff arthropathy.  Patient was seen by Dr. Durward Fortes and referred to this office.  He had a CT scan of the right shoulder that revealed complete supraspinatus and near complete infraspinatus tendon tears with 4 to 5 cm of retraction along with acromioclavicular and glenohumeral osteoarthritis.  Discussed options available to patient including injection for symptomatic relief versus lower trapezius tendon transfer versus reverse shoulder arthroplasty.   patient would not be a good candidate for lower trapezius tendon transfer due to his age and shoulder joint arthritis despite his desire to avoid shoulder replacement..  Provided patient with literature about reverse shoulder arthroplasty and discussed the surgery at length including risks and benefits.  Patient would likely have to give up some of his recreational activities with a reverse shoulder arthroplasty, including shooting his high caliber rifle.  Patient will consider this information and call the office if he decides to proceed with reverse shoulder arthroplasty.  Models utilized in the discussion and the biomechanics of reverse replacement also discussed patient agrees with plan.  Follow-Up Instructions: No follow-ups on file.   Orders:  No orders of the defined types were placed in this encounter.  No orders of the defined types were placed in this encounter.     Procedures: No procedures performed   Clinical Data: No additional findings.  Objective: Vital Signs: There were no vitals taken for this visit.  Physical Exam:  Constitutional: Patient appears well-developed HEENT:  Head: Normocephalic Eyes:EOM are normal Neck: Normal range of motion Cardiovascular: Normal rate Pulmonary/chest: Effort normal Neurologic: Patient is alert Skin: Skin is warm Psychiatric: Patient has normal mood and affect  Ortho Exam:  Right shoulder Exam Active forward flexion to 60 degrees.  Active abduction to 80 degrees. No loss of passive ER relative to the other  shoulder.  Good endpoint with ER No tenderness over the [facility] joint.  Moderate tenderness over the bicipital groove. 5/5 motor strength of the subscapularis muscle.  [place] weakness of the infraspinatus and supraspinatus muscles on the right. 5/5 grip strength, forearm pronation/supination, and bicep strength   Specialty Comments:  No specialty comments available.  Imaging: No results found.   PMFS History: Patient  Active Problem List   Diagnosis Date Noted  . Pain in right shoulder 12/07/2018   Past Medical History:  Diagnosis Date  . Cancer ([place])   . Diabetes mellitus without complication ([place])   . Hyperlipidemia     Family History  Problem Relation Age of Onset  . Heart disease Father     No past surgical history on file. Social History   Occupational History  . Occupation: Retired  Tobacco Use  . Smoking status: Never Smoker  . Smokeless tobacco: Never Used  Substance and Sexual Activity  . Alcohol use: Not Currently  . Drug use: Never  . Sexual activity: Not on file

## 2019-01-26 ENCOUNTER — Encounter: Payer: Self-pay | Admitting: Orthopedic Surgery

## 2019-02-22 ENCOUNTER — Telehealth: Payer: Self-pay | Admitting: Orthopedic Surgery

## 2019-02-22 NOTE — Telephone Encounter (Signed)
Patient would like to proceed with shoulder surgery.  VA auth is good. He was originally seen by Dr. Durward Fortes.  Patient stated he has had the CT scan.

## 2019-02-23 NOTE — Telephone Encounter (Signed)
FYI

## 2019-02-27 NOTE — Telephone Encounter (Signed)
Sheet done.  Please make sure he has a thin cut CT for preoperative planning purposes thanks

## 2019-03-19 ENCOUNTER — Telehealth: Payer: Self-pay | Admitting: Orthopedic Surgery

## 2019-03-19 NOTE — Telephone Encounter (Signed)
Per Josh with [employer] the CD that was ordered by Dr. Durward Fortes cannot be processed (was an arthrogram).  Please will need to be set up for thin cut CT.

## 2019-03-20 ENCOUNTER — Other Ambulatory Visit: Payer: Self-pay

## 2019-03-20 DIAGNOSIS — M12811 Other specific arthropathies, not elsewhere classified, right shoulder: Secondary | ICD-10-CM

## 2019-03-20 NOTE — Telephone Encounter (Signed)
I put in order for this scan.

## 2019-03-26 ENCOUNTER — Telehealth: Payer: Self-pay | Admitting: Orthopedic Surgery

## 2019-03-26 NOTE — Telephone Encounter (Signed)
Patient called and and stated another [facility] facility called in for CT Scan. Patient stated that if another CT Scan is need he would like to be scheduled at [facility] facility. Patient stated he would like a call back from Dr. Randel Pigg nurse or assistance to confirm if another CT Scan is needed. No appointment in system for upcoming CT Scan. Patient phone number is [phone].

## 2019-03-27 NOTE — Telephone Encounter (Signed)
IC s/w patient advised needed to be scheduled at [facility].  He will clal them back and get appt scheduled for scan.

## 2019-04-04 ENCOUNTER — Ambulatory Visit
Admission: RE | Admit: 2019-04-04 | Discharge: 2019-04-04 | Disposition: A | Discharge status: Home / Self Care | Payer: No Typology Code available for payment source | Source: Ambulatory Visit | Attending: Orthopedic Surgery | Admitting: Orthopedic Surgery

## 2019-04-04 ENCOUNTER — Other Ambulatory Visit: Payer: Self-pay

## 2019-04-04 DIAGNOSIS — M12811 Other specific arthropathies, not elsewhere classified, right shoulder: Secondary | ICD-10-CM

## 2019-04-04 IMAGING — CT CT SHOULDER*R* W/O CM
2 series · 10 of 14 positions shown, 12 images · non-contrast
Comparison: CT arthrogram right shoulder [DATE].

CLINICAL DATA: Right shoulder pain and limited range of motion. No
recent injury. Remote history of right shoulder surgery.

EXAM:
CT OF THE UPPER RIGHT EXTREMITY WITHOUT CONTRAST
TECHNIQUE: Multidetector CT imaging of the upper right extremity was performed
according to the standard protocol.

[Series 2: bone · axial · 0.48mm/px · z∈[-224,-90]mm · 5 of 101 slices shown, 7 images]
[im 17/101  soft-tissue]
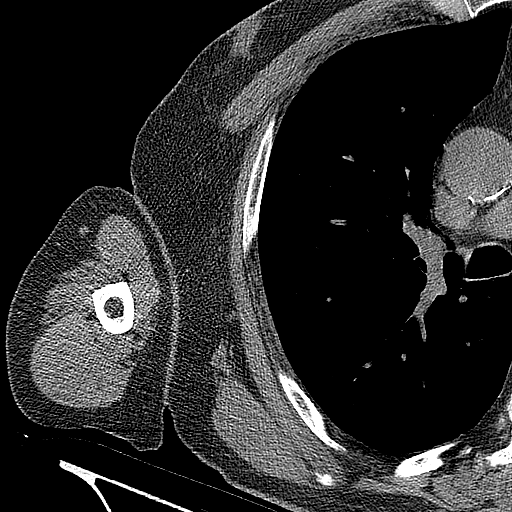
[im 17/101  bone]
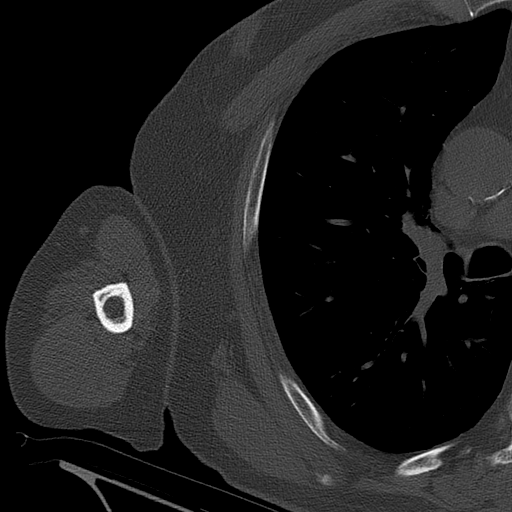
[im 34/101  bone]
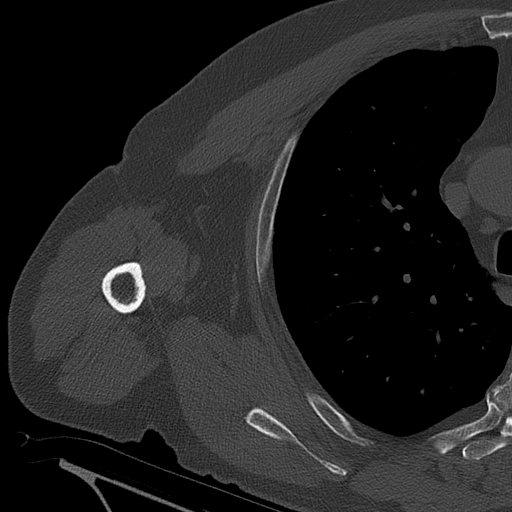
[im 51/101  bone]
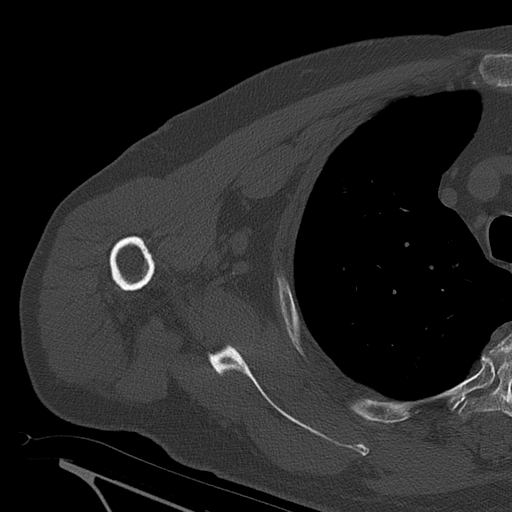
[im 67/101  bone]
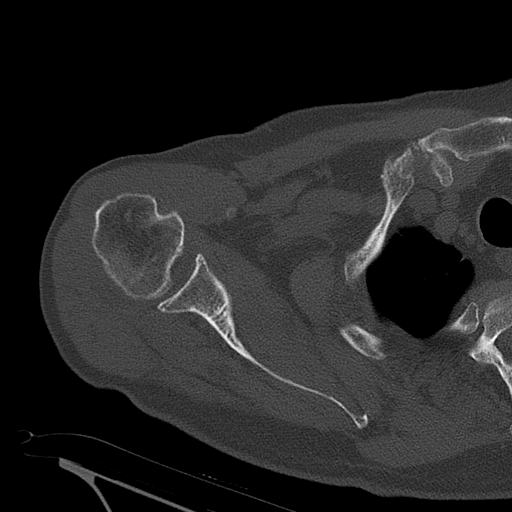
[im 84/101  soft-tissue]
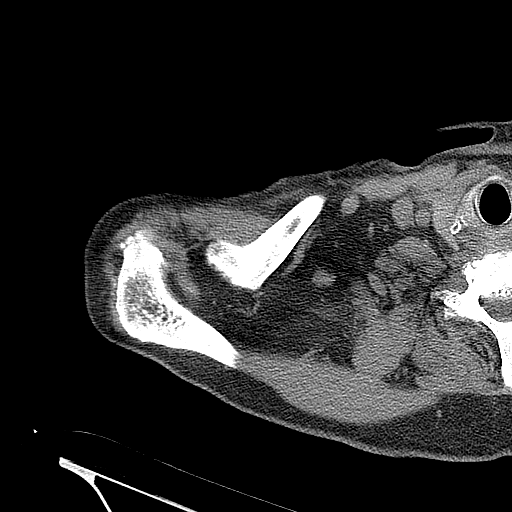
[im 84/101  bone]
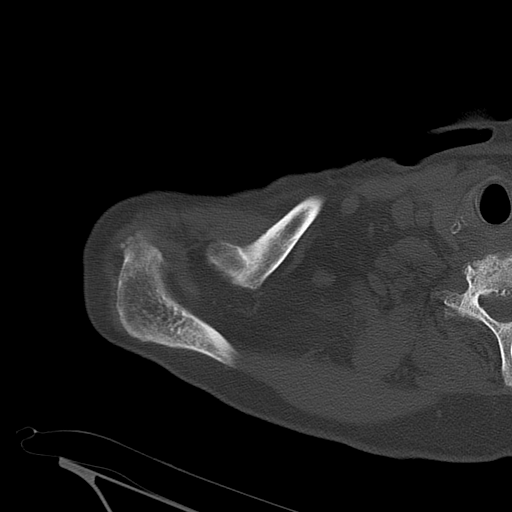

[Series 3: soft tissue · axial · 0.48mm/px · z∈[-218,-90]mm · 5 of 98 slices shown]
[im 17/98  soft-tissue]
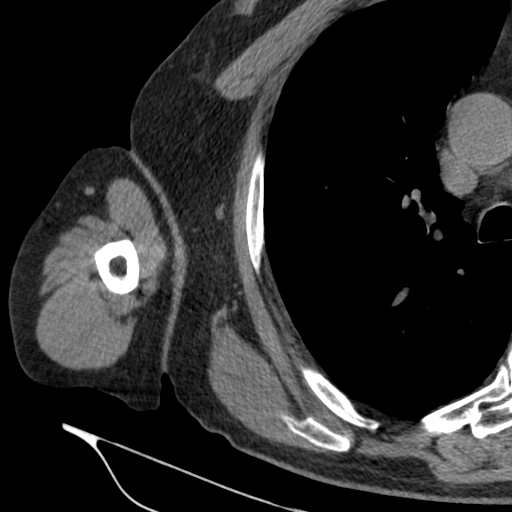
[im 33/98  soft-tissue]
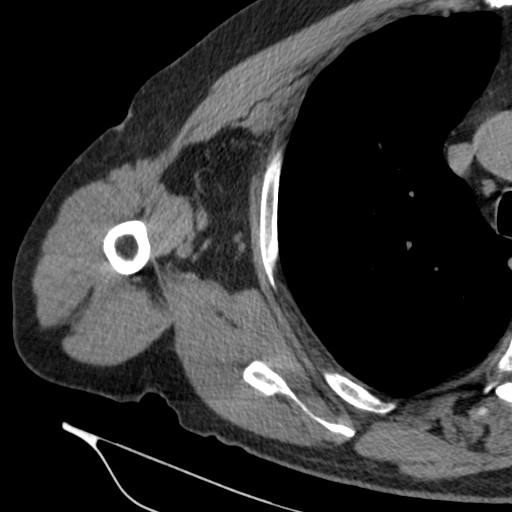
[im 49/98  soft-tissue]
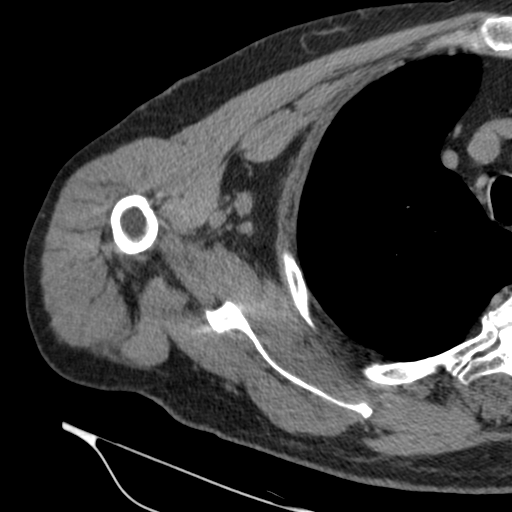
[im 65/98  soft-tissue]
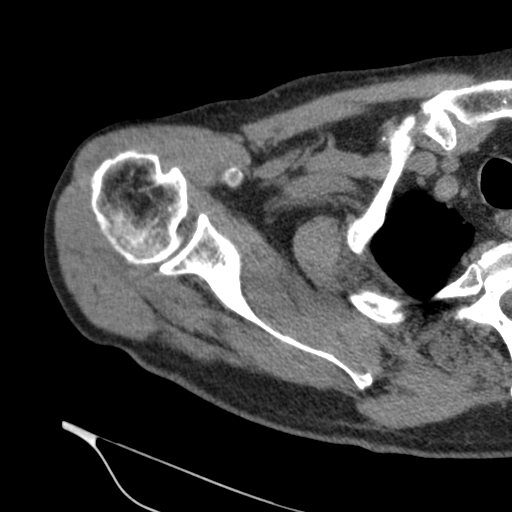
[im 81/98  soft-tissue]
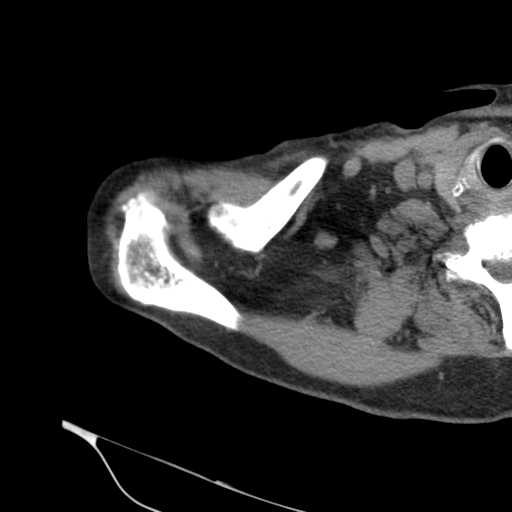

[10 of 14 positions shown; findings below may reference images not displayed]

FINDINGS: Bones/Joint/Cartilage

No acute bony or joint abnormality is seen. The humeral head is
high-riding due to rotator cuff tears described below. There is a
small osteophyte off the medial margin of the humeral head. No
notable subchondral cyst formation or edema is seen. Tracks for
anchors in the humeral head for rotator cuff repair noted. The
patient has moderate acromioclavicular osteoarthritis. The acromion
is type 2 with some subacromial spurring.

Ligaments

Suboptimally assessed by CT.

Muscles and Tendons

Complete supraspinatus and near-complete infraspinatus tendon tears
are better demonstrated on the prior CT arthrogram. Subscapularis
appears intact. Mild supraspinatus and infraspinatus atrophy is
again seen.

Soft tissues

Imaged lung parenchyma demonstrates extensive emphysematous disease.
Atherosclerosis also noted.
IMPRESSION: 1. Complete supraspinatus and near-complete infraspinatus tendon
tears are better demonstrated on the prior CT arthrogram. The
humeral head is high-riding due to the patient's cuff tears.
2. Moderate acromioclavicular osteoarthritis. Mild glenohumeral
osteoarthritis.
3. Status post rotator cuff repair.

## 2019-04-05 NOTE — Progress Notes (Signed)
Does he have return appointment?  He has rotator cuff arthropathy.  Reverse shoulder replacement would be the surgical option to treat his problem.  If he got this then cut CT scan and wants to schedule I can call and talk to him about the results or he can come in and we can go over it.

## 2019-04-06 ENCOUNTER — Telehealth: Payer: Self-pay

## 2019-04-06 NOTE — Telephone Encounter (Signed)
I spoke with patient about CT results and he would like you to call him to discuss further. He said its best for a phone call so he does not have to drive back to [place] and it is safer for him due to everything going on. He wanted to make sure that this could not be treated with PT. He also asked for a "scheduled time" for a call.  He said that we keep interrupting his hunting when we call and he request for a call at the end of one of your clinics.  [phone]

## 2019-04-08 NOTE — Telephone Encounter (Signed)
I called the patient.  Talk with him at length about reverse shoulder replacement.  He would like to proceed with that.  He has a couple of issues.  He does have COPD but his pulmonologist said go ahead and get the surgery done.  He also has history of diabetes and his hemoglobin A1c is 9 at this time.  I told him he would have to get that 8 or less over the next 6 weeks.  He also has a lung nodule which is going to get repeat radiographs in February.  Tentative plan at this time is to send in his disc so that we can get the reverse shoulder replacement on that left-hand side pending.  If you can go ahead and post that case for end of January early February and he will need to get hemoglobin A1c 2 weeks beforehand.  The procedure will be right reverse shoulder replacement using [employer] [id number].  Beachchair position with Exparel block per anesthesia.  I would also probably put him in an overnight stay category just because of his COPD in case the anesthesiologist back the phrenic nerve on the right-hand side.  Thanks

## 2019-04-09 NOTE — Telephone Encounter (Signed)
Please see note from Dr Marlou Sa about getting patient scheduled for February. Thanks.

## 2019-04-17 ENCOUNTER — Other Ambulatory Visit: Payer: Self-pay

## 2019-06-04 ENCOUNTER — Ambulatory Visit (HOSPITAL_BASED_OUTPATIENT_CLINIC_OR_DEPARTMENT_OTHER): Admit: 2019-06-04 | Payer: No Typology Code available for payment source | Admitting: Orthopedic Surgery

## 2019-06-04 ENCOUNTER — Encounter (HOSPITAL_BASED_OUTPATIENT_CLINIC_OR_DEPARTMENT_OTHER): Payer: Self-pay

## 2019-06-04 SURGERY — ARTHROPLASTY, SHOULDER, TOTAL, REVERSE
Anesthesia: General | Site: Shoulder | Laterality: Right

## 2020-06-08 NOTE — Progress Notes (Unsigned)
Cardiology Office Note:   Date:  06/11/2020  NAME:  Bryan Kent    MRN: [id number] DOB:  [birth year]   PCP:  [facility]  Cardiologist:  No primary care provider on file.  Electrophysiologist:  None   Referring MD: [facility]   Chief Complaint  Patient presents with  . Shortness of Breath   History of Present Illness:   Bryan Kent is a 70 y.o. male with a hx of COPD, DM, RA, PUD, arthritis who is being seen today for the evaluation of SOB at the request of [facility].  He reports he recently had a left heart catheterization at the [facility].  He was then sent here for further testing.  We are waiting on the records. He reports that he has been short of breath for years.  He has a heavy smoking history.  Nearly 30 pack years.  He has COPD.  CVD risk urgent with hypertension and diabetes.  He also has hyperlipidemia.  Takes Crestor.  He reports he still in [place].  Is moved to [facility].  This is to be closer to [facility].  He is a former [place] veteran.  His son served in the [employer] during the [place] war.  He apparently was killed in action in [place].  He also suffers from rheumatoid arthritis.  Apparently his shortness of breath occurred for years.  He underwent stress testing that was abnormal and a left heart catheterization at that was also abnormal at the [facility] in [place].  He reports that with any exertion he can get short of breath and tightness in his chest.  Symptoms occur daily.  They are worsening.  His blood pressure is 148/72.  He has never had a heart attack or stroke.  His EKG demonstrates sinus rhythm with PVCs on today's examination.  Family history of heart disease in his father.  He no longer smokes.  He does not drink alcohol.  No drug use reported.  He is single.  His wife died a number of years ago.  Update 06/12/2020: Records received from the [place].   He has a history of single-vessel CAD in 2019 her left heart catheterization.  He had a calcified proximal LAD lesion up to 60%.  He also had 40% left main lesion.  30% RCA.  This was treated medically.  He apparently has had progression of shortness of breath and chest tightness.  There is concern for worsening angina per the report from his cardiologist at the [place].  Given the calcification there was noted in the proximal LAD they recommended he be referred to an interventional center with capability of atherectomy.  They have requested IVUS and possible PCI depending on FFR.  Problem List 1. COPD -35 pack years  2. DM -A1c 9.8 3. PUD 4. Arthritis  5. Rheumatoid arthritis  6. CAD -Left heart cath 2019 at the [facility] -60% proximal LAD, 30% mid LAD -40% left main -0% left circumflex -30% RCA -Echo EF 55 to 37%, grade 1 diastolic dysfunction 7.  Hyperlipidemia -Total cholesterol 314, HDL 65, LDL 204, triglycerides 225 8. 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 Arthritis   . Cancer ([place])   . COPD (chronic obstructive pulmonary disease) ([place])   . Diabetes mellitus without complication ([place])   . Hyperlipidemia   .  Hypertension   . PTSD (post-traumatic stress disorder)   . Rheumatoid arthritis ([place])     Past Surgical History: Past Surgical History:  Procedure Laterality Date  . gastric ulcer    . KNEE SURGERY      Current Medications: Current Meds  Medication Sig  . albuterol (ACCUNEB) 0.63 MG/3ML nebulizer solution Inhale 1 puff into the lungs 2 (two) times daily as needed.  . budesonide-formoterol (SYMBICORT) 80-4.5 MCG/ACT inhaler Inhale into the lungs.  . folic acid (FOLVITE) 1 MG tablet Take 1 mg by mouth daily.   Marland Kitchen glipiZIDE (GLUCOTROL) 10 MG tablet Take 10 mg by mouth 2 (two) times daily before a meal.   . HYDROcodone-acetaminophen (NORCO/VICODIN) 5-325 MG tablet  Take 1 tablet by mouth at bedtime as needed for moderate pain.  . hydroxychloroquine (PLAQUENIL) 200 MG tablet Take 200 mg by mouth daily.  . Ipratropium-Albuterol (COMBIVENT) 20-100 MCG/ACT AERS respimat Inhale into the lungs.  . metFORMIN (GLUCOPHAGE) 1000 MG tablet Take 1,000 mg by mouth 2 (two) times daily with a meal.  . methotrexate (RHEUMATREX) 2.5 MG tablet Take 8 tablets by mouth every Saturday.  Marland Kitchen omeprazole (PRILOSEC) 20 MG capsule Take 20 mg by mouth daily.  . pioglitazone (ACTOS) 30 MG tablet Take 30 mg by mouth daily.      Allergies:    Lobster [shellfish allergy], Benzalkonium chloride, Pravastatin, Simvastatin, and Neosporin [bacitracin-polymyxin b]   Social History: Social History   Socioeconomic History  . Marital status: Single    Spouse name: Not on file  . Number of children: Not on file  . Years of education: Not on file  . Highest education level: Not on file  Occupational History  . Occupation: Retired - Geologist, engineering  . Occupation: [place] Veteran  Tobacco Use  . Smoking status: Former Smoker    Packs/day: 1.00    Years: 35.00    Pack years: 35.00  . Smokeless tobacco: Never Used  Substance and Sexual Activity  . Alcohol use: Not Currently  . Drug use: Never  . Sexual activity: Not on file  Other Topics Concern  . Not on file  Social History Narrative  . Not on file   Social Determinants of Health   Financial Resource Strain: Not on file  Food Insecurity: Not on file  Transportation Needs: Not on file  Physical Activity: Not on file  Stress: Not on file  Social Connections: Not on file     Family History: The patient's family history includes Asthma in his mother; Heart disease in his father.  ROS:   All other ROS reviewed and negative. Pertinent positives noted in the HPI.     EKGs/Labs/Other Studies Reviewed:   The following studies were personally reviewed by me today:  EKG:  EKG is ordered today.  The ekg ordered today demonstrates  sinus rhythm heart rate 98, PVCs noted, and was personally reviewed by me.   Recent Labs: No results found for requested labs within last 8760 hours.   Recent Lipid Panel No results found for: CHOL, TRIG, HDL, CHOLHDL, VLDL, LDLCALC, LDLDIRECT  Physical Exam:   VS:  BP (!) 148/72   Pulse 98   Ht 5\' 8"  (1.727 m)   Wt 180 lb 12.8 oz (82 kg)   SpO2 94%   BMI 27.49 kg/m    Wt Readings from Last 3 Encounters:  06/11/20 180 lb 12.8 oz (82 kg)  01/10/19 181 lb (82.1 kg)  12/07/18 180 lb (81.6 kg)    General:  Well nourished, well developed, in no acute distress Head: Atraumatic, normal size  Eyes: PEERLA, EOMI  Neck: Supple, no JVD Endocrine: No thryomegaly Cardiac: Normal S1, S2; RRR; no murmurs, rubs, or gallops Lungs: Clear to auscultation bilaterally, no wheezing, rhonchi or rales  Abd: Soft, nontender, no hepatomegaly  Ext: No edema, pulses 2+ Musculoskeletal: No deformities, BUE and BLE strength normal and equal Skin: Warm and dry, no rashes   Neuro: Alert and oriented to person, place, time, and situation, CNII-XII grossly intact, no focal deficits  Psych: Normal mood and affect   ASSESSMENT:   Graison B Lucado is a 69 y.o. male who presents for the following: 1. Coronary artery disease involving native coronary artery of native heart with unstable angina pectoris (HCC)   2. SOB (shortness of breath) on exertion   3. PVC (premature ventricular contraction)     PLAN:   1. Coronary artery disease involving native coronary artery of native heart with unstable angina pectoris (HCC) 2. SOB (shortness of breath) on exertion -Left heart cath in 2019 with 60% proximal LAD lesion.  The lesion was calcified.  He had a normal echocardiogram.  [facility] -[facility] with worsening symptoms of chest tightness and shortness of breath with activity.  He was recommended to undergo a left heart cath with CT FFR and possible PCI to the proximal LAD.   Given the calcifications in the lesion and possible need for atherectomy he was referred to [facility] for further evaluation.  I do not have images of the films.  Apparently the heart cath was in 2019.  A repeat heart cath was not performed.  We will have to proceed with this as above.  He completed blood work.  We will call and notify him of his Covid test and get him set up for left heart cath with possible PCI. -He will continue aspirin and statin.  He needs aggressive glycemic control.  [facility] so they may be reviewed by interventional cardiology. -Of note he has poorly controlled diabetes and his cholesterol is not controlled.  He does need aggressive risk factor modification.  [facility].  3. PVC (premature ventricular contraction) -possibly due to ischemia. LHC with possible PCI as above.   Shared Decision Making/Informed Consent The risks [stroke (1 in 1000), death (1 in 1000), kidney failure [usually temporary] (1 in 500), bleeding (1 in 200), allergic reaction [possibly serious] (1 in 200)], benefits (diagnostic support and management of coronary artery disease) and alternatives of a cardiac catheterization were discussed in detail with Mr. Plessinger and he is willing to proceed.  Disposition: Return in about 3 weeks (around 07/02/2020).  Medication Adjustments/Labs and Tests Ordered: Current medicines are reviewed at length with the patient today.  Concerns regarding medicines are outlined above.  Orders Placed This Encounter  Procedures  . CBC  . Basic metabolic panel  . EKG 12-Lead   No orders of the defined types were placed in this encounter.   Patient Instructions  Medication Instructions:  The current medical regimen is effective;  continue present plan and medications.  *If you need a refill on your cardiac medications before your next appointment, please call your pharmacy*   Lab Work: CBC, BMET today   Covid test needed- we will call you with this once we get cath scheduled.  If you have labs (blood work) drawn   today and your tests are completely normal, you will receive your results only by: Marland Kitchen MyChart Message (if you have MyChart) OR . A paper copy in the mail If you have any lab test that is abnormal or we need to change your treatment, we will call you to review the results.   Testing/Procedures: Your physician has requested that you have a cardiac catheterization. Cardiac catheterization is used to diagnose and/or treat various heart conditions. Doctors may recommend this procedure for a number of different reasons. The most common reason is to evaluate chest pain. Chest pain can be a symptom of coronary artery disease (CAD), and cardiac catheterization can show whether plaque is narrowing or blocking your heart's arteries. This procedure is also used to evaluate the valves, as well as measure the blood flow and oxygen levels in different parts of your heart. For further information please visit [email or web address]. Please follow instruction sheet, as given.     Follow-Up: At [facility], you and your health needs are our priority.  As part of our continuing mission to provide you with exceptional heart care, we have created designated Provider Care Teams.  These Care Teams include your primary Cardiologist (physician) and Advanced Practice Providers (APPs -  Physician Assistants and Nurse Practitioners) who all work together to provide you with the care you need, when you need it.  We recommend signing up for the patient portal called "MyChart".  Sign up information is provided on this After Visit Summary.  MyChart is used to connect with patients for Virtual Visits (Telemedicine).  Patients are able to view lab/test results, encounter notes, upcoming appointments, etc.  Non-urgent messages can be sent to your provider as well.   To learn more about what you can do with MyChart, go to  [email or web address].    Your next appointment:   3 week(s)  The format for your next appointment:   In Person  Provider:   Eleonore Chiquito, MD   Other Instructions    [address] [place] [address] Dept: [phone] Loc: [place]  06/11/2020  We will call you with an appointment to be scheduled-   1. Please arrive at the [facility] (Main Entrance A) at [facility]: [address] [place],  [address] at N/A (This time will be two hours before your procedure to ensure your preparation). Free valet parking service is available.   Special note: Every effort is made to have your procedure done on time. Please understand that emergencies sometimes delay scheduled procedures.  2. Diet: Do not eat solid foods after midnight.  The patient may have clear liquids until 5am upon the day of the procedure.  3. Labs: You will need to have blood drawn today- CBC, BMET   4. Medication instructions in preparation for your procedure:   Contrast Allergy: No  Hold Metformin 48 hours before and 24 hours after Hold Glipizide day of procedure.  On the morning of your procedure, take your Aspirin and any morning medicines NOT listed above.  You may use sips of water.  5. Plan for one night stay--bring personal belongings. 6. Bring a current list of your medications and current insurance cards. 7. You MUST have a responsible person to drive you home. 8. Someone MUST be with you the first 24 hours after you arrive home or your discharge will be delayed. 9. Please wear clothes that are easy to  get on and off and wear slip-on shoes.  Thank you for allowing [place] to care for you!   -- [facility]      Signed, Addison Naegeli. Audie Box, [place]  [address] [place], [place] [address] [phone]  06/11/2020 7:21 PM

## 2020-06-08 NOTE — H&P (View-Only) (Signed)
Cardiology Office Note:   Date:  06/11/2020  NAME:  Bryan Kent    MRN: [id number] DOB:  04/04/1951   PCP:  [facility]  Cardiologist:  No primary care provider on file.  Electrophysiologist:  None   Referring MD: [facility]   Chief Complaint  Patient presents with  . Shortness of Breath   History of Present Illness:   Bryan Kent is a 70 y.o. male with a hx of COPD, DM, RA, PUD, arthritis who is being seen today for the evaluation of SOB at the request of [facility].  He reports he recently had a left heart catheterization at the [facility].  He was then sent here for further testing.  We are waiting on the records. He reports that he has been short of breath for years.  He has a heavy smoking history.  Nearly 30 pack years.  He has COPD.  CVD risk urgent with hypertension and diabetes.  He also has hyperlipidemia.  Takes Crestor.  He reports he still in [place].  Is moved to [facility].  This is to be closer to [facility].  He is a former [place] veteran.  His son served in the [employer] during the [place] war.  He apparently was killed in action in [place].  He also suffers from rheumatoid arthritis.  Apparently his shortness of breath occurred for years.  He underwent stress testing that was abnormal and a left heart catheterization at that was also abnormal at the [facility] in [place].  He reports that with any exertion he can get short of breath and tightness in his chest.  Symptoms occur daily.  They are worsening.  His blood pressure is 148/72.  He has never had a heart attack or stroke.  His EKG demonstrates sinus rhythm with PVCs on today's examination.  Family history of heart disease in his father.  He no longer smokes.  He does not drink alcohol.  No drug use reported.  He is single.  His wife died a number of years ago.  Update 06/12/2020: Records received from the [place].   He has a history of single-vessel CAD in 2019 her left heart catheterization.  He had a calcified proximal LAD lesion up to 60%.  He also had 40% left main lesion.  30% RCA.  This was treated medically.  He apparently has had progression of shortness of breath and chest tightness.  There is concern for worsening angina per the report from his cardiologist at the [place].  Given the calcification there was noted in the proximal LAD they recommended he be referred to an interventional center with capability of atherectomy.  They have requested IVUS and possible PCI depending on FFR.  Problem List 1. COPD -35 pack years  2. DM -A1c 9.8 3. PUD 4. Arthritis  5. Rheumatoid arthritis  6. CAD -Left heart cath 2019 at the [facility] -60% proximal LAD, 30% mid LAD -40% left main -0% left circumflex -30% RCA -Echo EF 55 to 09%, grade 1 diastolic dysfunction 7.  Hyperlipidemia -Total cholesterol 314, HDL 65, LDL 204, triglycerides 225 8. 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 Arthritis   . Cancer ([place])   . COPD (chronic obstructive pulmonary disease) ([place])   . Diabetes mellitus without complication ([place])   . Hyperlipidemia   .  Hypertension   . PTSD (post-traumatic stress disorder)   . Rheumatoid arthritis ([place])     Past Surgical History: Past Surgical History:  Procedure Laterality Date  . gastric ulcer    . KNEE SURGERY      Current Medications: Current Meds  Medication Sig  . albuterol (ACCUNEB) 0.63 MG/3ML nebulizer solution Inhale 1 puff into the lungs 2 (two) times daily as needed.  . budesonide-formoterol (SYMBICORT) 80-4.5 MCG/ACT inhaler Inhale into the lungs.  . folic acid (FOLVITE) 1 MG tablet Take 1 mg by mouth daily.   Marland Kitchen glipiZIDE (GLUCOTROL) 10 MG tablet Take 10 mg by mouth 2 (two) times daily before a meal.   . HYDROcodone-acetaminophen (NORCO/VICODIN) 5-325 MG tablet  Take 1 tablet by mouth at bedtime as needed for moderate pain.  . hydroxychloroquine (PLAQUENIL) 200 MG tablet Take 200 mg by mouth daily.  . Ipratropium-Albuterol (COMBIVENT) 20-100 MCG/ACT AERS respimat Inhale into the lungs.  . metFORMIN (GLUCOPHAGE) 1000 MG tablet Take 1,000 mg by mouth 2 (two) times daily with a meal.  . methotrexate (RHEUMATREX) 2.5 MG tablet Take 8 tablets by mouth every Saturday.  Marland Kitchen omeprazole (PRILOSEC) 20 MG capsule Take 20 mg by mouth daily.  . pioglitazone (ACTOS) 30 MG tablet Take 30 mg by mouth daily.      Allergies:    Lobster [shellfish allergy], Benzalkonium chloride, Pravastatin, Simvastatin, and Neosporin [bacitracin-polymyxin b]   Social History: Social History   Socioeconomic History  . Marital status: Single    Spouse name: Not on file  . Number of children: Not on file  . Years of education: Not on file  . Highest education level: Not on file  Occupational History  . Occupation: Retired - Geologist, engineering  . Occupation: [place] Veteran  Tobacco Use  . Smoking status: Former Smoker    Packs/day: 1.00    Years: 35.00    Pack years: 35.00  . Smokeless tobacco: Never Used  Substance and Sexual Activity  . Alcohol use: Not Currently  . Drug use: Never  . Sexual activity: Not on file  Other Topics Concern  . Not on file  Social History Narrative  . Not on file   Social Determinants of Health   Financial Resource Strain: Not on file  Food Insecurity: Not on file  Transportation Needs: Not on file  Physical Activity: Not on file  Stress: Not on file  Social Connections: Not on file     Family History: The patient's family history includes Asthma in his mother; Heart disease in his father.  ROS:   All other ROS reviewed and negative. Pertinent positives noted in the HPI.     EKGs/Labs/Other Studies Reviewed:   The following studies were personally reviewed by me today:  EKG:  EKG is ordered today.  The ekg ordered today demonstrates  sinus rhythm heart rate 98, PVCs noted, and was personally reviewed by me.   Recent Labs: No results found for requested labs within last 8760 hours.   Recent Lipid Panel No results found for: CHOL, TRIG, HDL, CHOLHDL, VLDL, LDLCALC, LDLDIRECT  Physical Exam:   VS:  BP (!) 148/72   Pulse 98   Ht 5\' 8"  (1.727 m)   Wt 180 lb 12.8 oz (82 kg)   SpO2 94%   BMI 27.49 kg/m    Wt Readings from Last 3 Encounters:  06/11/20 180 lb 12.8 oz (82 kg)  01/10/19 181 lb (82.1 kg)  12/07/18 180 lb (81.6 kg)    General:  Well nourished, well developed, in no acute distress Head: Atraumatic, normal size  Eyes: PEERLA, EOMI  Neck: Supple, no JVD Endocrine: No thryomegaly Cardiac: Normal S1, S2; RRR; no murmurs, rubs, or gallops Lungs: Clear to auscultation bilaterally, no wheezing, rhonchi or rales  Abd: Soft, nontender, no hepatomegaly  Ext: No edema, pulses 2+ Musculoskeletal: No deformities, BUE and BLE strength normal and equal Skin: Warm and dry, no rashes   Neuro: Alert and oriented to person, place, time, and situation, CNII-XII grossly intact, no focal deficits  Psych: Normal mood and affect   ASSESSMENT:   Bryan Kent is a 70 y.o. male who presents for the following: 1. Coronary artery disease involving native coronary artery of native heart with unstable angina pectoris ([place])   2. SOB (shortness of breath) on exertion   3. PVC (premature ventricular contraction)     PLAN:   1. Coronary artery disease involving native coronary artery of native heart with unstable angina pectoris ([place]) 2. SOB (shortness of breath) on exertion -Left heart cath in 2019 with 60% proximal LAD lesion.  The lesion was calcified.  He had a normal echocardiogram.  He follows at the [place]. -He was seen by his [facility] doctor on 05/27/2020 with worsening symptoms of chest tightness and shortness of breath with activity.  He was recommended to undergo a left heart cath with CT FFR and possible PCI to the proximal LAD.   Given the calcifications in the lesion and possible need for atherectomy he was referred to [facility] for further evaluation.  I do not have images of the films.  Apparently the heart cath was in 2019.  A repeat heart cath was not performed.  We will have to proceed with this as above.  He completed blood work.  We will call and notify him of his Covid test and get him set up for left heart cath with possible PCI. -He will continue aspirin and statin.  He needs aggressive glycemic control.  We will upload the records from the [place] so they may be reviewed by interventional cardiology. -Of note he has poorly controlled diabetes and his cholesterol is not controlled.  He does need aggressive risk factor modification.  [facility]  3. PVC (premature ventricular contraction) -possibly due to ischemia. LHC with possible PCI as above.   Shared Decision Making/Informed Consent The risks [stroke (1 in 1000), death (1 in 1000), kidney failure [usually temporary] (1 in 500), bleeding (1 in 200), allergic reaction [possibly serious] (1 in 200)], benefits (diagnostic support and management of coronary artery disease) and alternatives of a cardiac catheterization were discussed in detail with Bryan Kent and he is willing to proceed.  Disposition: Return in about 3 weeks (around 07/02/2020).  Medication Adjustments/Labs and Tests Ordered: Current medicines are reviewed at length with the patient today.  Concerns regarding medicines are outlined above.  Orders Placed This Encounter  Procedures  . CBC  . Basic metabolic panel  . EKG 12-Lead   No orders of the defined types were placed in this encounter.   Patient Instructions  Medication Instructions:  The current medical regimen is effective;  continue present plan and medications.  *If you need a refill on your cardiac medications before your next appointment, please call your pharmacy*   Lab Work: CBC, BMET today   Covid test needed- we will call you with this once we get cath scheduled.  If you have labs (blood work) drawn  today and your tests are completely normal, you will receive your results only by: Marland Kitchen MyChart Message (if you have MyChart) OR . A paper copy in the mail If you have any lab test that is abnormal or we need to change your treatment, we will call you to review the results.   Testing/Procedures: Your physician has requested that you have a cardiac catheterization. Cardiac catheterization is used to diagnose and/or treat various heart conditions. Doctors may recommend this procedure for a number of different reasons. The most common reason is to evaluate chest pain. Chest pain can be a symptom of coronary artery disease (CAD), and cardiac catheterization can show whether plaque is narrowing or blocking your heart's arteries. This procedure is also used to evaluate the valves, as well as measure the blood flow and oxygen levels in different parts of your heart. For further information please visit [email or web address].[email or web address]. Please follow instruction sheet, as given.     Follow-Up: At [facility], you and your health needs are our priority.  As part of our continuing mission to provide you with exceptional heart care, we have created designated Provider Care Teams.  These Care Teams include your primary Cardiologist (physician) and Advanced Practice Providers (APPs -  Physician Assistants and Nurse Practitioners) who all work together to provide you with the care you need, when you need it.  We recommend signing up for the patient portal called "MyChart".  Sign up information is provided on this After Visit Summary.  MyChart is used to connect with patients for Virtual Visits (Telemedicine).  Patients are able to view lab/test results, encounter notes, upcoming appointments, etc.  Non-urgent messages can be sent to your provider as well.   To learn more about what you can do with MyChart, go to  [email or web address].    Your next appointment:   3 week(s)  The format for your next appointment:   In Person  Provider:   Eleonore Chiquito, MD   Other Instructions    [place] [address] Dept: [phone] Loc: [place]  06/11/2020  We will call you with an appointment to be scheduled-   1. Please arrive at the [facility] (Main Entrance A) at [facility]: [address] [place],  [address] at N/A (This time will be two hours before your procedure to ensure your preparation). Free valet parking service is available.   Special note: Every effort is made to have your procedure done on time. Please understand that emergencies sometimes delay scheduled procedures.  2. Diet: Do not eat solid foods after midnight.  The patient may have clear liquids until 5am upon the day of the procedure.  3. Labs: You will need to have blood drawn today- CBC, BMET   4. Medication instructions in preparation for your procedure:   Contrast Allergy: No  Hold Metformin 48 hours before and 24 hours after Hold Glipizide day of procedure.  On the morning of your procedure, take your Aspirin and any morning medicines NOT listed above.  You may use sips of water.  5. Plan for one night stay--bring personal belongings. 6. Bring a current list of your medications and current insurance cards. 7. You MUST have a responsible person to drive you home. 8. Someone MUST be with you the first 24 hours after you arrive home or your discharge will be delayed. 9. Please wear clothes that are easy to  get on and off and wear slip-on shoes.  Thank you for allowing [place] to care for you!   -- [facility]      Signed, Addison Naegeli. Audie Box, [place]  [address], [place], [place] [address] [phone]  06/11/2020 7:21 PM

## 2020-06-11 ENCOUNTER — Other Ambulatory Visit: Payer: Self-pay

## 2020-06-11 ENCOUNTER — Ambulatory Visit (INDEPENDENT_AMBULATORY_CARE_PROVIDER_SITE_OTHER): Payer: No Typology Code available for payment source | Admitting: Cardiovascular Disease

## 2020-06-11 ENCOUNTER — Encounter: Payer: Self-pay | Admitting: Cardiovascular Disease

## 2020-06-11 VITALS — BP 148/72 | HR 98 | Ht 68.0 in | Wt 180.8 lb

## 2020-06-11 DIAGNOSIS — I25110 Atherosclerotic heart disease of native coronary artery with unstable angina pectoris: Secondary | ICD-10-CM

## 2020-06-11 DIAGNOSIS — R0602 Shortness of breath: Secondary | ICD-10-CM

## 2020-06-11 DIAGNOSIS — I493 Ventricular premature depolarization: Secondary | ICD-10-CM | POA: Diagnosis not present

## 2020-06-11 NOTE — Patient Instructions (Signed)
Medication Instructions:  The current medical regimen is effective;  continue present plan and medications.  *If you need a refill on your cardiac medications before your next appointment, please call your pharmacy*   Lab Work: CBC, BMET today  Covid test needed- we will call you with this once we get cath scheduled.  If you have labs (blood work) drawn today and your tests are completely normal, you will receive your results only by: Marland Kitchen MyChart Message (if you have MyChart) OR . A paper copy in the mail If you have any lab test that is abnormal or we need to change your treatment, we will call you to review the results.   Testing/Procedures: Your physician has requested that you have a cardiac catheterization. Cardiac catheterization is used to diagnose and/or treat various heart conditions. Doctors may recommend this procedure for a number of different reasons. The most common reason is to evaluate chest pain. Chest pain can be a symptom of coronary artery disease (CAD), and cardiac catheterization can show whether plaque is narrowing or blocking your heart's arteries. This procedure is also used to evaluate the valves, as well as measure the blood flow and oxygen levels in different parts of your heart. For further information please visit HugeFiesta.[email or web address]. Please follow instruction sheet, as given.     Follow-Up: At [facility], you and your health needs are our priority.  As part of our continuing mission to provide you with exceptional heart care, we have created designated Provider Care Teams.  These Care Teams include your primary Cardiologist (physician) and Advanced Practice Providers (APPs -  Physician Assistants and Nurse Practitioners) who all work together to provide you with the care you need, when you need it.  We recommend signing up for the patient portal called "MyChart".  Sign up information is provided on this After Visit Summary.  MyChart is used to connect with  patients for Virtual Visits (Telemedicine).  Patients are able to view lab/test results, encounter notes, upcoming appointments, etc.  Non-urgent messages can be sent to your provider as well.   To learn more about what you can do with MyChart, go to [email or web address].    Your next appointment:   3 week(s)  The format for your next appointment:   In Person  Provider:   Eleonore Chiquito, MD   Other Instructions    [place] [address] Dept: [phone] Loc: [place]  06/11/2020  We will call you with an appointment to be scheduled-   1. Please arrive at the [facility] (Main Entrance A) at [facility]: [address] [place], [place] [address] at N/A (This time will be two hours before your procedure to ensure your preparation). Free valet parking service is available.   Special note: Every effort is made to have your procedure done on time. Please understand that emergencies sometimes delay scheduled procedures.  2. Diet: Do not eat solid foods after midnight.  The patient may have clear liquids until 5am upon the day of the procedure.  3. Labs: You will need to have blood drawn today- CBC, BMET   4. Medication instructions in preparation for your procedure:   Contrast Allergy: No  Hold Metformin 48 hours before and 24 hours after Hold Glipizide day of procedure.  On the morning of your procedure, take your Aspirin and any morning medicines NOT listed above.  You may use  sips of water.  5. Plan for one night stay--bring personal belongings. 6. Bring a current list of your medications and current insurance cards. 7. You MUST have a responsible person to drive you home. 8. Someone MUST be with you the first 24 hours after you arrive home or your discharge will be delayed. 9. Please wear clothes that are easy to get on and off  and wear slip-on shoes.  Thank you for allowing [place] to care for you!   -- [facility]

## 2020-06-12 LAB — CBC
Hematocrit: 44.4 % (ref 37.5–51.0)
Hemoglobin: 14.8 g/dL (ref 13.0–17.7)
MCH: 33.1 pg — ABNORMAL HIGH (ref 26.6–33.0)
MCHC: 33.3 g/dL (ref 31.5–35.7)
MCV: 99 fL — ABNORMAL HIGH (ref 79–97)
Platelets: 276 10*3/uL (ref 150–450)
RBC: 4.47 x10E6/uL (ref 4.14–5.80)
RDW: 13.3 % (ref 11.6–15.4)
WBC: 11.2 10*3/uL — ABNORMAL HIGH (ref 3.4–10.8)

## 2020-06-12 LAB — BASIC METABOLIC PANEL
BUN/Creatinine Ratio: 20 (ref 10–24)
BUN: 17 mg/dL (ref 8–27)
CO2: 22 mmol/L (ref 20–29)
Calcium: 9.2 mg/dL (ref 8.6–10.2)
Chloride: 97 mmol/L (ref 96–106)
Creatinine, Ser: 0.86 mg/dL (ref 0.76–1.27)
GFR calc Af Amer: 103 mL/min/{1.73_m2} (ref >59)
GFR calc non Af Amer: 89 mL/min/{1.73_m2} (ref >59)
Glucose: 172 mg/dL — ABNORMAL HIGH (ref 65–99)
Potassium: 4.3 mmol/L (ref 3.5–5.2)
Sodium: 137 mmol/L (ref 134–144)

## 2020-06-12 NOTE — Addendum Note (Signed)
Addended by: [name] on: 06/12/2020 11:34 AM   Modules accepted: Level of Service

## 2020-06-13 ENCOUNTER — Telehealth: Payer: Self-pay

## 2020-06-13 NOTE — Telephone Encounter (Signed)
[name], MD  06/12/2020 6:39 PM EST Back to Top     When you call him to schedule his left heart catheterization please inform him that his labs are normal.  Bryan Kent T. Audie Box, MD, [place]  [address] [place], [place] [address] [phone]  6:39 PM   Spoke with patient, heart cath is scheduled for Thursday March 3rd at 11:30am with Dr. Gwenlyn Found. Patient to be at hospital at 9:30. Patient has had labs drawn already and is aware these are normal. Covid Test is scheduled for Monday 2/28 at 10:25am.  Patient aware of instructions and verbalized understanding.

## 2020-06-13 NOTE — Telephone Encounter (Signed)
Orders placed. Thank you.

## 2020-06-16 ENCOUNTER — Other Ambulatory Visit (HOSPITAL_COMMUNITY)
Admission: RE | Admit: 2020-06-16 | Discharge: 2020-06-16 | Disposition: A | Discharge status: Home / Self Care | Payer: No Typology Code available for payment source | Source: Ambulatory Visit | Attending: Cardiovascular Disease | Admitting: Cardiovascular Disease

## 2020-06-16 DIAGNOSIS — Z20822 Contact with and (suspected) exposure to covid-19: Secondary | ICD-10-CM | POA: Diagnosis not present

## 2020-06-16 DIAGNOSIS — Z01812 Encounter for preprocedural laboratory examination: Secondary | ICD-10-CM | POA: Diagnosis present

## 2020-06-16 LAB — SARS CORONAVIRUS 2 (TAT 6-24 HRS): SARS Coronavirus 2: NEGATIVE

## 2020-06-17 ENCOUNTER — Telehealth: Payer: Self-pay | Admitting: *Deleted

## 2020-06-17 NOTE — Telephone Encounter (Addendum)
Pt contacted pre-catheterization scheduled at [facility] for: June 19, 2020 11:30 AM Verified arrival time and place: [place] [facility]) at: 9:30 AM   No solid food after midnight prior to cath, clear liquids until 5 AM day of procedure.  Hold: Actos-AM of procedure Jardiance-AM of procedure Glipizide-AM of procedure Metformin-day of procedure and 48 hours post procedure  Except hold medications AM meds can be  taken pre-cath with sips of water including: ASA 81 mg Plavix 75 mg  Confirmed patient has responsible adult to drive home post procedure and be with patient first 24 hours after arriving home: yes  You are allowed ONE visitor in the waiting room during the time you are at the hospital for your procedure. Both you and your visitor must wear a mask once you enter the hospital.  Reviewed procedure/mask/visitor instructions reviewed with patient.

## 2020-06-19 ENCOUNTER — Other Ambulatory Visit: Payer: Self-pay

## 2020-06-19 ENCOUNTER — Ambulatory Visit (HOSPITAL_COMMUNITY)
Admission: RE | Admit: 2020-06-19 | Discharge: 2020-06-20 | Disposition: A | Discharge status: Home / Self Care | Payer: No Typology Code available for payment source | Attending: Cardiovascular Disease | Admitting: Cardiovascular Disease

## 2020-06-19 ENCOUNTER — Ambulatory Visit (HOSPITAL_COMMUNITY)
Admission: RE | Disposition: A | Discharge status: Home / Self Care | Payer: Self-pay | Source: Home / Self Care | Attending: Cardiovascular Disease

## 2020-06-19 ENCOUNTER — Encounter (HOSPITAL_COMMUNITY): Payer: Self-pay | Admitting: Cardiovascular Disease

## 2020-06-19 DIAGNOSIS — E1165 Type 2 diabetes mellitus with hyperglycemia: Secondary | ICD-10-CM | POA: Diagnosis not present

## 2020-06-19 DIAGNOSIS — Z79899 Other long term (current) drug therapy: Secondary | ICD-10-CM | POA: Insufficient documentation

## 2020-06-19 DIAGNOSIS — J449 Chronic obstructive pulmonary disease, unspecified: Secondary | ICD-10-CM

## 2020-06-19 DIAGNOSIS — I2584 Coronary atherosclerosis due to calcified coronary lesion: Secondary | ICD-10-CM | POA: Diagnosis not present

## 2020-06-19 DIAGNOSIS — Z7951 Long term (current) use of inhaled steroids: Secondary | ICD-10-CM | POA: Insufficient documentation

## 2020-06-19 DIAGNOSIS — Z7984 Long term (current) use of oral hypoglycemic drugs: Secondary | ICD-10-CM | POA: Diagnosis not present

## 2020-06-19 DIAGNOSIS — Z888 Allergy status to other drugs, medicaments and biological substances status: Secondary | ICD-10-CM | POA: Insufficient documentation

## 2020-06-19 DIAGNOSIS — Z87891 Personal history of nicotine dependence: Secondary | ICD-10-CM | POA: Diagnosis not present

## 2020-06-19 DIAGNOSIS — I10 Essential (primary) hypertension: Secondary | ICD-10-CM | POA: Diagnosis not present

## 2020-06-19 DIAGNOSIS — I493 Ventricular premature depolarization: Secondary | ICD-10-CM | POA: Insufficient documentation

## 2020-06-19 DIAGNOSIS — E785 Hyperlipidemia, unspecified: Secondary | ICD-10-CM

## 2020-06-19 DIAGNOSIS — Z8711 Personal history of peptic ulcer disease: Secondary | ICD-10-CM | POA: Diagnosis not present

## 2020-06-19 DIAGNOSIS — R079 Chest pain, unspecified: Secondary | ICD-10-CM

## 2020-06-19 DIAGNOSIS — I25118 Atherosclerotic heart disease of native coronary artery with other forms of angina pectoris: Secondary | ICD-10-CM | POA: Insufficient documentation

## 2020-06-19 DIAGNOSIS — E118 Type 2 diabetes mellitus with unspecified complications: Secondary | ICD-10-CM

## 2020-06-19 DIAGNOSIS — I2510 Atherosclerotic heart disease of native coronary artery without angina pectoris: Secondary | ICD-10-CM | POA: Diagnosis present

## 2020-06-19 DIAGNOSIS — Z8249 Family history of ischemic heart disease and other diseases of the circulatory system: Secondary | ICD-10-CM | POA: Diagnosis not present

## 2020-06-19 DIAGNOSIS — Z955 Presence of coronary angioplasty implant and graft: Secondary | ICD-10-CM

## 2020-06-19 HISTORY — PX: CORONARY ATHERECTOMY: CATH118238

## 2020-06-19 HISTORY — PX: CORONARY STENT INTERVENTION: CATH118234

## 2020-06-19 HISTORY — PX: LEFT HEART CATH AND CORONARY ANGIOGRAPHY: CATH118249

## 2020-06-19 HISTORY — PX: CORONARY PRESSURE/FFR WITH 3D MAPPING: CATH118309

## 2020-06-19 HISTORY — PX: CORONARY IMAGING/OCT: CATH118326

## 2020-06-19 HISTORY — DX: Atherosclerotic heart disease of native coronary artery without angina pectoris: I25.10

## 2020-06-19 LAB — GLUCOSE, CAPILLARY
Glucose-Capillary: 180 mg/dL — ABNORMAL HIGH (ref 70–99)
Glucose-Capillary: 132 mg/dL — ABNORMAL HIGH (ref 70–99)
Glucose-Capillary: 85 mg/dL (ref 70–99)
Glucose-Capillary: 125 mg/dL — ABNORMAL HIGH (ref 70–99)

## 2020-06-19 LAB — POCT ACTIVATED CLOTTING TIME
Activated Clotting Time: 249 seconds
Activated Clotting Time: 380 seconds
Activated Clotting Time: 244 seconds
Activated Clotting Time: 321 seconds

## 2020-06-19 SURGERY — LEFT HEART CATH AND CORONARY ANGIOGRAPHY
Anesthesia: LOCAL

## 2020-06-19 MED ORDER — FENTANYL CITRATE (PF) 100 MCG/2ML IJ SOLN
INTRAMUSCULAR | Status: AC
  Filled 2020-06-19: qty 2

## 2020-06-19 MED ORDER — NITROGLYCERIN 1 MG/10 ML FOR IR/CATH LAB
INTRA_ARTERIAL | Status: AC
  Filled 2020-06-19: qty 10

## 2020-06-19 MED ORDER — CLOPIDOGREL BISULFATE 75 MG PO TABS: 75.0000 mg | ORAL_TABLET | Freq: Every day | ORAL | Status: DC

## 2020-06-19 MED ORDER — SODIUM CHLORIDE 0.9% FLUSH
3.0000 mL | Freq: Two times a day (BID) | INTRAVENOUS | Status: DC
  Administered 2020-06-20: 3 mL via INTRAVENOUS

## 2020-06-19 MED ORDER — SODIUM CHLORIDE 0.9% FLUSH: 3.0000 mL | Freq: Two times a day (BID) | INTRAVENOUS | Status: DC

## 2020-06-19 MED ORDER — CLOPIDOGREL BISULFATE 75 MG PO TABS
75.0000 mg | ORAL_TABLET | Freq: Once | ORAL | Status: AC
  Administered 2020-06-19: 75 mg via ORAL
  Filled 2020-06-19: qty 1

## 2020-06-19 MED ORDER — ACETAMINOPHEN 325 MG PO TABS
650.0000 mg | ORAL_TABLET | ORAL | Status: DC | PRN
  Administered 2020-06-19 – 2020-06-20 (×3): 650 mg via ORAL
  Filled 2020-06-19 (×2): qty 2

## 2020-06-19 MED ORDER — CLOPIDOGREL BISULFATE 300 MG PO TABS
ORAL_TABLET | ORAL | Status: AC
  Filled 2020-06-19: qty 1

## 2020-06-19 MED ORDER — HEPARIN SODIUM (PORCINE) 1000 UNIT/ML IJ SOLN
INTRAMUSCULAR | Status: AC
  Filled 2020-06-19: qty 1

## 2020-06-19 MED ORDER — MORPHINE SULFATE (PF) 2 MG/ML IV SOLN: 2.0000 mg | INTRAVENOUS | Status: DC | PRN

## 2020-06-19 MED ORDER — LIDOCAINE HCL (PF) 1 % IJ SOLN
INTRAMUSCULAR | Status: DC | PRN
  Administered 2020-06-19: 2 mL

## 2020-06-19 MED ORDER — GLIPIZIDE 10 MG PO TABS
10.0000 mg | ORAL_TABLET | Freq: Every day | ORAL | Status: DC
  Filled 2020-06-19: qty 1

## 2020-06-19 MED ORDER — MIDAZOLAM HCL 2 MG/2ML IJ SOLN
INTRAMUSCULAR | Status: AC
  Filled 2020-06-19: qty 2

## 2020-06-19 MED ORDER — IPRATROPIUM-ALBUTEROL 20-100 MCG/ACT IN AERS: 1.0000 | INHALATION_SPRAY | Freq: Four times a day (QID) | RESPIRATORY_TRACT | Status: DC | PRN

## 2020-06-19 MED ORDER — VERAPAMIL HCL 2.5 MG/ML IV SOLN
INTRAVENOUS | Status: AC
  Filled 2020-06-19: qty 2

## 2020-06-19 MED ORDER — PANTOPRAZOLE SODIUM 40 MG PO TBEC
40.0000 mg | DELAYED_RELEASE_TABLET | Freq: Every day | ORAL | Status: DC
  Administered 2020-06-19 – 2020-06-20 (×2): 40 mg via ORAL
  Filled 2020-06-19 (×2): qty 1

## 2020-06-19 MED ORDER — CLOPIDOGREL BISULFATE 75 MG PO TABS
75.0000 mg | ORAL_TABLET | Freq: Every day | ORAL | Status: DC
  Administered 2020-06-20: 75 mg via ORAL
  Filled 2020-06-19: qty 1

## 2020-06-19 MED ORDER — VERAPAMIL HCL 2.5 MG/ML IV SOLN
INTRA_ARTERIAL | Status: DC | PRN
  Administered 2020-06-19 (×2): 3 mL via INTRA_ARTERIAL

## 2020-06-19 MED ORDER — FENTANYL CITRATE (PF) 100 MCG/2ML IJ SOLN
INTRAMUSCULAR | Status: DC | PRN
  Administered 2020-06-19: 50 ug via INTRAVENOUS
  Administered 2020-06-19 (×2): 25 ug via INTRAVENOUS

## 2020-06-19 MED ORDER — ALBUTEROL SULFATE 0.63 MG/3ML IN NEBU: 0.6300 mg | INHALATION_SOLUTION | Freq: Three times a day (TID) | RESPIRATORY_TRACT | Status: DC | PRN

## 2020-06-19 MED ORDER — LABETALOL HCL 5 MG/ML IV SOLN: 10.0000 mg | INTRAVENOUS | Status: AC | PRN

## 2020-06-19 MED ORDER — GUAIFENESIN 200 MG PO TABS
400.0000 mg | ORAL_TABLET | Freq: Four times a day (QID) | ORAL | Status: DC | PRN
  Filled 2020-06-19: qty 2

## 2020-06-19 MED ORDER — ACETAMINOPHEN 325 MG PO TABS
ORAL_TABLET | ORAL | Status: AC
  Filled 2020-06-19: qty 2

## 2020-06-19 MED ORDER — HEPARIN (PORCINE) IN NACL 1000-0.9 UT/500ML-% IV SOLN
INTRAVENOUS | Status: DC | PRN
  Administered 2020-06-19: 500 mL

## 2020-06-19 MED ORDER — CLOPIDOGREL BISULFATE 300 MG PO TABS
ORAL_TABLET | ORAL | Status: DC | PRN
  Administered 2020-06-19: 300 mg via ORAL

## 2020-06-19 MED ORDER — HEPARIN (PORCINE) IN NACL 1000-0.9 UT/500ML-% IV SOLN
INTRAVENOUS | Status: AC
  Filled 2020-06-19: qty 1500

## 2020-06-19 MED ORDER — HEPARIN SODIUM (PORCINE) 1000 UNIT/ML IJ SOLN
INTRAMUSCULAR | Status: DC | PRN
  Administered 2020-06-19: 4000 [IU] via INTRAVENOUS
  Administered 2020-06-19: 3000 [IU] via INTRAVENOUS
  Administered 2020-06-19: 4000 [IU] via INTRAVENOUS
  Administered 2020-06-19: 3000 [IU] via INTRAVENOUS

## 2020-06-19 MED ORDER — ASPIRIN 81 MG PO CHEW: 81.0000 mg | CHEWABLE_TABLET | ORAL | Status: DC

## 2020-06-19 MED ORDER — SODIUM CHLORIDE 0.9 % WEIGHT BASED INFUSION
3.0000 mL/kg/h | INTRAVENOUS | Status: DC
  Administered 2020-06-19: 3 mL/kg/h via INTRAVENOUS

## 2020-06-19 MED ORDER — HYDRALAZINE HCL 20 MG/ML IJ SOLN: 10.0000 mg | INTRAMUSCULAR | Status: AC | PRN

## 2020-06-19 MED ORDER — LIDOCAINE HCL (PF) 1 % IJ SOLN
INTRAMUSCULAR | Status: AC
  Filled 2020-06-19: qty 30

## 2020-06-19 MED ORDER — MIDAZOLAM HCL 2 MG/2ML IJ SOLN
INTRAMUSCULAR | Status: DC | PRN
  Administered 2020-06-19 (×2): 1 mg via INTRAVENOUS

## 2020-06-19 MED ORDER — SODIUM CHLORIDE 0.9 % WEIGHT BASED INFUSION: 1.0000 mL/kg/h | INTRAVENOUS | Status: DC

## 2020-06-19 MED ORDER — SODIUM CHLORIDE 0.9 % IV SOLN: 250.0000 mL | INTRAVENOUS | Status: DC | PRN

## 2020-06-19 MED ORDER — SODIUM CHLORIDE 0.9 % IV SOLN: INTRAVENOUS | Status: AC

## 2020-06-19 MED ORDER — SODIUM CHLORIDE 0.9% FLUSH: 3.0000 mL | INTRAVENOUS | Status: DC | PRN

## 2020-06-19 MED ORDER — EMPAGLIFLOZIN 25 MG PO TABS
25.0000 mg | ORAL_TABLET | Freq: Every day | ORAL | Status: DC
  Administered 2020-06-20: 25 mg via ORAL
  Filled 2020-06-19: qty 1

## 2020-06-19 MED ORDER — HYDROXYCHLOROQUINE SULFATE 200 MG PO TABS
200.0000 mg | ORAL_TABLET | Freq: Every day | ORAL | Status: DC
  Administered 2020-06-19 – 2020-06-20 (×2): 200 mg via ORAL
  Filled 2020-06-19 (×2): qty 1

## 2020-06-19 MED ORDER — ASPIRIN 81 MG PO CHEW
81.0000 mg | CHEWABLE_TABLET | Freq: Every day | ORAL | Status: DC
  Administered 2020-06-20: 81 mg via ORAL
  Filled 2020-06-19: qty 1

## 2020-06-19 MED ORDER — AZITHROMYCIN 250 MG PO TABS
250.0000 mg | ORAL_TABLET | ORAL | Status: DC
  Administered 2020-06-20: 250 mg via ORAL
  Filled 2020-06-19: qty 1

## 2020-06-19 MED ORDER — ALBUTEROL SULFATE (2.5 MG/3ML) 0.083% IN NEBU: 2.5000 mg | INHALATION_SOLUTION | Freq: Three times a day (TID) | RESPIRATORY_TRACT | Status: DC | PRN

## 2020-06-19 MED ORDER — ONDANSETRON HCL 4 MG/2ML IJ SOLN: 4.0000 mg | Freq: Four times a day (QID) | INTRAMUSCULAR | Status: DC | PRN

## 2020-06-19 MED ORDER — ASPIRIN EC 81 MG PO TBEC: 81.0000 mg | DELAYED_RELEASE_TABLET | Freq: Every day | ORAL | Status: DC

## 2020-06-19 MED ORDER — IOHEXOL 350 MG/ML SOLN
INTRAVENOUS | Status: DC | PRN
  Administered 2020-06-19: 320 mL

## 2020-06-19 MED ORDER — METHOTREXATE 2.5 MG PO TABS: 20.0000 mg | ORAL_TABLET | ORAL | Status: DC

## 2020-06-19 SURGICAL SUPPLY — 33 items
BALLN SAPPHIRE 2.5X15 (BALLOONS) ×2
BALLN SAPPHIRE ~~LOC~~ 3.0X12 (BALLOONS) ×2 IMPLANT
BALLN SAPPHIRE ~~LOC~~ 3.5X12 (BALLOONS) ×2 IMPLANT
BALLN SAPPHIRE ~~LOC~~ 4.0X10 (BALLOONS) ×2 IMPLANT
BALLOON SAPPHIRE 2.5X15 (BALLOONS) ×1 IMPLANT
CATH DRAGONFLY OPSTAR (CATHETERS) ×2 IMPLANT
CATH INFINITI 5FR ANG PIGTAIL (CATHETERS) ×2 IMPLANT
CATH INFINITI JR4 5F (CATHETERS) ×2 IMPLANT
CATH OPTITORQUE TIG 4.0 5F (CATHETERS) ×2 IMPLANT
CATH TELEPORT (CATHETERS) ×2 IMPLANT
CATH VISTA GUIDE 6FR XBLAD3.0 (CATHETERS) ×2 IMPLANT
CATH VISTA GUIDE 6FR XBLAD3.5 (CATHETERS) ×2 IMPLANT
CROWN DIAMONDBACK CLASSIC 1.25 (BURR) ×2 IMPLANT
DEVICE RAD COMP TR BAND LRG (VASCULAR PRODUCTS) ×2 IMPLANT
ELECT DEFIB PAD ADLT CADENCE (PAD) ×2 IMPLANT
GLIDESHEATH SLEND A-KIT 6F 22G (SHEATH) ×2 IMPLANT
GUIDEWIRE INQWIRE 1.5J.035X260 (WIRE) ×1 IMPLANT
GUIDEWIRE PRESSURE X 175 (WIRE) ×2 IMPLANT
INQWIRE 1.5J .035X260CM (WIRE) ×2
KIT ENCORE 26 ADVANTAGE (KITS) ×2 IMPLANT
KIT HEART LEFT (KITS) ×2 IMPLANT
PACK CARDIAC CATHETERIZATION (CUSTOM PROCEDURE TRAY) ×2 IMPLANT
STENT SYNERGY XD 2.75X28 (Permanent Stent) ×1 IMPLANT
STENT SYNERGY XD 3.50X16 (Permanent Stent) ×1 IMPLANT
SYNERGY XD 2.75X28 (Permanent Stent) ×2 IMPLANT
SYNERGY XD 3.50X16 (Permanent Stent) ×2 IMPLANT
SYR MEDRAD MARK 7 150ML (SYRINGE) ×2 IMPLANT
TRANSDUCER W/STOPCOCK (MISCELLANEOUS) ×2 IMPLANT
TUBING CIL FLEX 10 FLL-RA (TUBING) ×2 IMPLANT
WIRE ASAHI PROWATER 180CM (WIRE) ×2 IMPLANT
WIRE ASAHI PROWATER 300CM (WIRE) ×2 IMPLANT
WIRE HI TORQ VERSACORE-J 145CM (WIRE) ×2 IMPLANT
WIRE VIPERWIRE COR FLEX .012 (WIRE) ×2 IMPLANT

## 2020-06-19 NOTE — Plan of Care (Signed)
  Problem: Education: Goal: Knowledge of General Education information will improve Description Including pain rating scale, medication(s)/side effects and non-pharmacologic comfort measures Outcome: Progressing   

## 2020-06-19 NOTE — Interval H&P Note (Signed)
Cath Lab Visit (complete for each Cath Lab visit)  Clinical Evaluation Leading to the Procedure:   ACS: No.  Non-ACS:    Anginal Classification: CCS I  Anti-ischemic medical therapy: No Therapy  Non-Invasive Test Results: No non-invasive testing performed  Prior CABG: No previous CABG      History and Physical Interval Note:  06/19/2020 10:27 AM  Bryan Kent  has presented today for surgery, with the diagnosis of shortness of breath.  The various methods of treatment have been discussed with the patient and family. After consideration of risks, benefits and other options for treatment, the patient has consented to  Procedure(s): LEFT HEART CATH AND CORONARY ANGIOGRAPHY (N/A) as a surgical intervention.  The patient's history has been reviewed, patient examined, no change in status, stable for surgery.  I have reviewed the patient's chart and labs.  Questions were answered to the patient's satisfaction.     Quay Burow

## 2020-06-20 ENCOUNTER — Other Ambulatory Visit: Payer: Self-pay | Admitting: Student

## 2020-06-20 ENCOUNTER — Encounter (HOSPITAL_COMMUNITY): Payer: Self-pay | Admitting: Cardiovascular Disease

## 2020-06-20 DIAGNOSIS — E785 Hyperlipidemia, unspecified: Secondary | ICD-10-CM

## 2020-06-20 DIAGNOSIS — Z955 Presence of coronary angioplasty implant and graft: Secondary | ICD-10-CM

## 2020-06-20 DIAGNOSIS — J449 Chronic obstructive pulmonary disease, unspecified: Secondary | ICD-10-CM

## 2020-06-20 DIAGNOSIS — E118 Type 2 diabetes mellitus with unspecified complications: Secondary | ICD-10-CM

## 2020-06-20 DIAGNOSIS — I493 Ventricular premature depolarization: Secondary | ICD-10-CM | POA: Diagnosis not present

## 2020-06-20 DIAGNOSIS — I2584 Coronary atherosclerosis due to calcified coronary lesion: Secondary | ICD-10-CM | POA: Diagnosis not present

## 2020-06-20 DIAGNOSIS — I10 Essential (primary) hypertension: Secondary | ICD-10-CM

## 2020-06-20 DIAGNOSIS — R079 Chest pain, unspecified: Secondary | ICD-10-CM

## 2020-06-20 DIAGNOSIS — I25118 Atherosclerotic heart disease of native coronary artery with other forms of angina pectoris: Secondary | ICD-10-CM | POA: Diagnosis not present

## 2020-06-20 LAB — CBC
HCT: 40.7 % (ref 39.0–52.0)
Hemoglobin: 12.9 g/dL — ABNORMAL LOW (ref 13.0–17.0)
MCH: 33 pg (ref 26.0–34.0)
MCHC: 31.7 g/dL (ref 30.0–36.0)
MCV: 104.1 fL — ABNORMAL HIGH (ref 80.0–100.0)
Platelets: 276 10*3/uL (ref 150–400)
RBC: 3.91 MIL/uL — ABNORMAL LOW (ref 4.22–5.81)
RDW: 14.6 % (ref 11.5–15.5)
WBC: 10.8 10*3/uL — ABNORMAL HIGH (ref 4.0–10.5)
nRBC: 0 % (ref 0.0–0.2)

## 2020-06-20 LAB — BASIC METABOLIC PANEL
Anion gap: 10 (ref 5–15)
BUN: 13 mg/dL (ref 8–23)
CO2: 24 mmol/L (ref 22–32)
Calcium: 8.8 mg/dL — ABNORMAL LOW (ref 8.9–10.3)
Chloride: 102 mmol/L (ref 98–111)
Creatinine, Ser: 0.77 mg/dL (ref 0.61–1.24)
GFR, Estimated: >60 mL/min (ref >60)
Glucose, Bld: 150 mg/dL — ABNORMAL HIGH (ref 70–99)
Potassium: 4.1 mmol/L (ref 3.5–5.1)
Sodium: 136 mmol/L (ref 135–145)

## 2020-06-20 LAB — GLUCOSE, CAPILLARY: Glucose-Capillary: 172 mg/dL — ABNORMAL HIGH (ref 70–99)

## 2020-06-20 MED ORDER — ROSUVASTATIN CALCIUM 5 MG PO TABS: 5.0000 mg | ORAL_TABLET | Freq: Every day | ORAL | 2 refills | Status: DC

## 2020-06-20 MED ORDER — NITROGLYCERIN 0.4 MG SL SUBL: 0.4000 mg | SUBLINGUAL_TABLET | SUBLINGUAL | 0 refills | Status: DC | PRN

## 2020-06-20 MED ORDER — NITROGLYCERIN 0.4 MG SL SUBL: 0.4000 mg | SUBLINGUAL_TABLET | SUBLINGUAL | 2 refills | Status: DC | PRN

## 2020-06-20 MED ORDER — ROSUVASTATIN CALCIUM 5 MG PO TABS: 5.0000 mg | ORAL_TABLET | Freq: Every day | ORAL | 0 refills | Status: DC

## 2020-06-20 MED ORDER — PANTOPRAZOLE SODIUM 40 MG PO TBEC: 40.0000 mg | DELAYED_RELEASE_TABLET | Freq: Every day | ORAL | 1 refills | Status: DC

## 2020-06-20 MED ORDER — PANTOPRAZOLE SODIUM 40 MG PO TBEC: 40.0000 mg | DELAYED_RELEASE_TABLET | Freq: Every day | ORAL | 0 refills | Status: DC

## 2020-06-20 MED ORDER — CLOPIDOGREL BISULFATE 75 MG PO TABS: 75.0000 mg | ORAL_TABLET | Freq: Every day | ORAL | 5 refills | Status: DC

## 2020-06-20 MED FILL — ROSUVASTATIN CALCIUM 5 MG T: 5 | 14 days supply | Qty: 14 | Fill #0

## 2020-06-20 MED FILL — PANTOPRAZOLE SOD DR 40 MG T: 40 | 14 days supply | Qty: 14 | Fill #0

## 2020-06-20 MED FILL — NITROGLYCERIN 0.4 MG TAB SL: 0.4 | 8 days supply | Qty: 25 | Fill #0

## 2020-06-20 MED FILL — Heparin Sod (Porcine)-NaCl IV Soln 1000 Unit/500ML-0.9%: INTRAVENOUS | Qty: 1000 | Status: AC

## 2020-06-20 NOTE — Discharge Summary (Addendum)
Discharge Summary    Patient ID: Bryan Kent MRN: [id number]; DOB: 26-Aug-1950  Admit date: 06/19/2020 Discharge date: 06/20/2020  PCP:  Clinic, [facility]  Cardiologist:  Evalina Field, MD  Electrophysiologist:  None   Discharge Diagnoses    Principal Problem:   Chest pain of uncertain etiology Active Problems:   CAD (coronary artery disease)   Hypertension   Hyperlipidemia   Type 2 diabetes mellitus with complication, without long-term current use of insulin ([place])   COPD (chronic obstructive pulmonary disease) ([place])    Diagnostic Studies/Procedures    Cardiac Catheterization 06/19/2020:  Prox RCA to Mid RCA lesion is 40% stenosed.  Prox LAD lesion is 70% stenosed.  Mid LAD lesion is 95% stenosed.  A drug-eluting stent was successfully placed using a SYNERGY XD 2.75X28.  Post intervention, there is a 0% residual stenosis.  A drug-eluting stent was successfully placed using a SYNERGY XD 3.50X16.  Post intervention, there is a 0% residual stenosis.  The left ventricular systolic function is normal.  LV end diastolic pressure is normal.  The left ventricular ejection fraction is 50-55% by visual estimate.  Impressions: Successful orbital atherectomy of a highly calcified proximal mid LAD followed by OCT guided PCI and drug-eluting stenting.  Initially this was performed after DFR suggested that the combined lesions were physiologically significant.  Patient was already on aspirin and Plavix.  He will be hydrated overnight, discharged home in the morning.  He will will require dual antiplatelet therapy uninterrupted for at least 12 months.  He left the lab in stable condition.  Diagnostic Dominance: Co-dominant    Intervention       _____________   History of Present Illness     Bryan Kent is a 70 y.o. male with a history of non-obstructive CAD on cardiac catheterization in 2019 at the [place] (treated  medically), PAD with ectatic abdominal aorta and right common iliac aneurysm, hypertension, hyperlipidemia, diabetes mellitus, COPD with 35 pack year smoking history (quit in 2006), PUD,  rheumatoid arthritis, and PTSD who was recently referred to Dr. Audie Box for further evaluation of shortness of breath.   Patient has a long history of shortness of breath. He had an abnormal stress test in 2019 and thus underwent a left heart catheterization at the [place] in [place] in 2019. Cath showed 40% stenosis of left main, 60% calcified stenosis of proximal LAD, and 30% stenosis of RCA. This was medically treated. Patient has had progression of shortness of breath and chest tightness with any exertion. Symptoms have worsened and are now occurring daily. Given calcification on last cardiac cath, patient was referred to [facility] since we have the capability of atherectomy.  Decision was made to proceed with outpatient cardiac catheterization.  Hospital Course     Consultants: None  Patient presented for outpatient cardiac catheterization on 06/19/2020. Cath showed 70% stenosis of proximal LAD followed by 95% stenosis of mid LAD and 40% stenosis of proximal to mid RCA. Patient underwent successful orbital atherectomy of highly calcified proximal to mid LAD followed by OCT guided PCI with DES x2. Patient tolerated the procedure well. He was admitted overnight for hydration. No recurrent chest pain and shortness of breath significant improved. Plan is to continue DAPT with Aspiring 62m daily and Plavix 756mdaily (already on this at home). Patient currently not on a statin. He reports developing a rash with Pravastatin and Simvastatin in the past (no trouble breathing and symptoms concerning  for anaphylaxis) and then had severe myalgias with Atorvastatin but no rash with this. Discussed with Pharmacy - will try Rosuvastatin 19m daily to see if he is able to tolerate this. I do not see a lipid panel in our system so  this will need to be checked as outpatient. Advised stopping Omprezole and starting Protonix for GERD. Continue home medications. Can restart Metformin 48 hours after cardiac catheterization.  Of note, patient hemoglobin did drop from 14.8 on pre-cath labs to 12.9 on day of discharge. Right radial cath site soft/clean/dry with no signs of hematoma. No obvious signs of bleed. Can follow-up as outpatient.  Patient seen and examined today by Dr. OAudie Boxand determined to be stable for discharge. Outpatient follow-up arranged. Medications as below.  Did the patient have an acute coronary syndrome (MI, NSTEMI, STEMI, etc) this admission?:  No                               Did the patient have a percutaneous coronary intervention (stent / angioplasty)?:  Yes.     Cath/PCI Registry Performance & Quality Measures: 1. Aspirin prescribed? - Yes 2. ADP Receptor Inhibitor (Plavix/Clopidogrel, Brilinta/Ticagrelor or Effient/Prasugrel) prescribed (includes medically managed patients)? - Yes 3. High Intensity Statin (Lipitor 40-881mor Crestor 20-4053mprescribed? - No - intolerant to multiple statin (will try Crestor 5mg28mily though) 4. For EF <40%, was ACEI/ARB prescribed? - Not Applicable (EF >/= 40%)49% For EF <40%, Aldosterone Antagonist (Spironolactone or Eplerenone) prescribed? - Not Applicable (EF >/= 40%)67% Cardiac Rehab Phase II ordered? - Yes _____________  Discharge Vitals Blood pressure 130/81, pulse 72, temperature 98 F (36.7 C), temperature source Oral, resp. rate 17, height 5' 8"  (1.727 m), weight 81.6 kg, SpO2 96 %.  Filed Weights   06/19/20 0932  Weight: 81.6 kg   General: 69 y27. male resting comfortably in no acute distress. HEENT: Normocephalic and atraumatic. Sclera clear.  Neck: Supple. No JVD. Heart: RRR. Distinct S1 and S2. No murmurs, gallops, or rubs. Radial pulses 2+ and equal bilaterally. Lungs: No increased work of breathing. Clear to ausculation bilaterally. No  wheezes, rhonchi, or rales.  Abdomen: Soft, non-distended, and non-tender to palpation.  MSK: Normal strength and tone for age. Extremities: No lower extremity edema.    Skin: Warm and dry. Neuro: Alert and oriented x3. No focal deficits. Psych: Normal affect. Responds appropriately.  Labs & Radiologic Studies    CBC Recent Labs    06/20/20 0347  WBC 10.8*  HGB 12.9*  HCT 40.7  MCV 104.1*  PLT 276 591asic Metabolic Panel Recent Labs    06/20/20 0347  NA 136  K 4.1  CL 102  CO2 24  GLUCOSE 150*  BUN 13  CREATININE 0.77  CALCIUM 8.8*   Liver Function Tests No results for input(s): AST, ALT, ALKPHOS, BILITOT, PROT, ALBUMIN in the last 72 hours. No results for input(s): LIPASE, AMYLASE in the last 72 hours. High Sensitivity Troponin:   No results for input(s): TROPONINIHS in the last 720 hours.  BNP Invalid input(s): POCBNP D-Dimer No results for input(s): DDIMER in the last 72 hours. Hemoglobin A1C No results for input(s): HGBA1C in the last 72 hours. Fasting Lipid Panel No results for input(s): CHOL, HDL, LDLCALC, TRIG, CHOLHDL, LDLDIRECT in the last 72 hours. Thyroid Function Tests No results for input(s): TSH, T4TOTAL, T3FREE, THYROIDAB in the last 72 hours.  Invalid input(s): FREET3 _____________  CARDIAC CATHETERIZATION  Result Date: 06/19/2020  Prox RCA to Mid RCA lesion is 40% stenosed.  Prox LAD lesion is 70% stenosed.  Mid LAD lesion is 95% stenosed.  A drug-eluting stent was successfully placed using a SYNERGY XD 2.75X28.  Post intervention, there is a 0% residual stenosis.  A drug-eluting stent was successfully placed using a SYNERGY XD 3.50X16.  Post intervention, there is a 0% residual stenosis.  The left ventricular systolic function is normal.  LV end diastolic pressure is normal.  The left ventricular ejection fraction is 50-55% by visual estimate.  Bryan Kent is a 70 y.o. male  [id number] LOCATION:  FACILITY: [facility] PHYSICIAN: Quay Burow, M.D. 05-Jan-1951 DATE OF PROCEDURE:  06/19/2020 DATE OF DISCHARGE: CARDIAC CATHETERIZATION / Orbital Atherectomy, OCT, PCI-DES LAD History obtained from chart review.  Mr. Masterson is a 70 year old patient of Dr. Kathalene Frames history of COPD, hypertension, hyperlipidemia and diabetes.  He apparently had a heart cath at the [facility] in [place] in 2019 and had obstructive disease but was not treated at that time.  He has had progressive dyspnea on exertion.  Dr. Audie Box referred him for outpatient diagnostic coronary angiography to define his anatomy. PROCEDURE DESCRIPTION: The patient was brought to the second floor [facility] cath lab in the postabsorptive state. He was premedicated with IV Versed and fentanyl. His right wrist was prepped and shaved in usual sterile fashion. Xylocaine 1% was used for local anesthesia. A 6 French sheath was inserted into the right radial  artery using standard Seldinger technique. The patient received 12,000 units  of heparin intravenously.  A 5 [place] TIG catheter, right Judkins catheter, pigtail catheters were used for selective coronary angiography and left ventriculography respectively.  Isovue dye was used for the entirety of the case.  Retrograde aorta, ventricular and pullback pressures were recorded. Patient received weight-based heparin with an ACT in the 300 range.  A total of 320 cc of contrast was administered to the patient.  He did have over the prescribed amount of radiation during the case.  Using a 6 [place] XB LAD 3.0 cm guide catheter I crossed the proximal and mid LAD lesion with a Abbott DFR wire.  The DFR was 0.84 suggesting that the lesions were physiologically significant.  Isovue dye was used for the entirety of the intervention.  Retroaortic pressures monitored during the case.  The patient was already on aspirin and Plavix. Following this, and because of the excessive amount of fluoroscopic calcification it was decided to proceed with orbital  atherectomy to debulk complex modified.  I was able to get a 014 300 cm length [place] guidewire across the proximal mid lesion in the distal LAD and exchanged through a [place] endhole catheter for a FlexTip Viper CSI wire.  I then performed orbital atherectomy with a 1.25 mm micro bur of both the proximal mid lesion performing multiple runs at low speed.  I then exchanged through the [place] catheter for the [place] guidewire and predilated the mid LAD lesion with a 2.5 mm x 50 mm balloon.  Following this I performed OCT revealing a pre mid MLA of 1.71 mm and a proximal of 3.35 mm.  I used OCT to measure the length of the diseased segment and placed a 2.75 x 28 mm long [place] Synergy drug-eluting stent deployed at 14 atm.  I postdilated the proximal two thirds of the stent with a 3 mm noncompliant balloon.  I then overlapped this with a 3.5 mm x 60  mm long Synergy drug-eluting stent with the previously placed stent (approximately 2 mm overlap) and covered the proximal lesion deploying at 16 atm.  I postdilated the proximal stent with a 4 mm balloon at 14 to 16 atm and then postdilated the overlap area with a 3.5 x 12 mm balloon at 14 to 16 atm resulting reduction of a proximal 70 and mid 95% calcified LAD stenoses to 0% residual.  There was about 5 mm of uncovered ostial/proximal LAD which was highly calcified by OCT but did not appear to be flow-limiting.  The guidewire and catheter were removed.  The sheath was removed and a TR band was placed on the right wrist to achieve patent hemostasis.  The patient left lab in stable condition.   Successful orbital atherectomy of a highly calcified proximal mid LAD followed by OCT guided PCI and drug-eluting stenting.  Initially this was performed after DFR suggested that the combined lesions were physiologically significant.  Patient was already on aspirin and Plavix.  He will be hydrated overnight, discharged home in the morning.  He will will require dual  antiplatelet therapy uninterrupted for at least 12 months.  He left the lab in stable condition. Quay Burow. MD, [facility] 06/19/2020 1:20 PM   Disposition   Patient is being discharged home today in good condition.  Follow-up Plans & Appointments     Follow-up Information    O'Neal, Cassie Freer, MD Follow up.   Specialties: Internal Medicine, Cardiology, Radiology Why: Follow-up visit scheduled for 07/03/2020 at 10:20am. Please arrive 15 minutes early for check-in. If this date/time does not work for you, please call our office to reschedule. Contact information: [place] [address] [phone]              Discharge Instructions    Amb Referral to Cardiac Rehabilitation   Complete by: As directed    Diagnosis: Coronary Stents   After initial evaluation and assessments completed: Virtual Based Care may be provided alone or in conjunction with Phase 2 Cardiac Rehab based on patient barriers.: Yes   Diet - low sodium heart healthy   Complete by: As directed    Increase activity slowly   Complete by: As directed       Discharge Medications   Allergies as of 06/20/2020      Reactions   Lobster [shellfish Allergy] Hives   Benzalkonium Chloride Rash   Pravastatin Rash   Simvastatin Rash   Neosporin [bacitracin-polymyxin B]       Medication List    STOP taking these medications   HYDROcodone-acetaminophen 5-325 MG tablet Commonly known as: NORCO/VICODIN   omeprazole 20 MG capsule Commonly known as: PRILOSEC Replaced by: pantoprazole 40 MG tablet     TAKE these medications   albuterol 0.63 MG/3ML nebulizer solution Commonly known as: ACCUNEB Inhale 0.63 mg into the lungs in the morning and at bedtime.   ALPRAZolam 0.5 MG tablet Commonly known as: XANAX Take 0.5 mg by mouth 2 (two) times daily as needed for anxiety.   arformoterol 15 MCG/2ML Nebu Commonly known as: BROVANA Take 15 mcg by nebulization 2 (two) times daily.   aspirin EC 81  MG tablet Take 81 mg by mouth daily. Swallow whole.   azithromycin 250 MG tablet Commonly known as: ZITHROMAX Take 250 mg by mouth every Monday, Wednesday, and Friday.   budesonide-formoterol 80-4.5 MCG/ACT inhaler Commonly known as: SYMBICORT Inhale 1 puff into the lungs in the morning and at bedtime.   clopidogrel 75  MG tablet Commonly known as: PLAVIX Take 1 tablet (75 mg total) by mouth daily.   empagliflozin 25 MG Tabs tablet Commonly known as: JARDIANCE Take 25 mg by mouth daily.   folic acid 1 MG tablet Commonly known as: FOLVITE Take 1 mg by mouth daily.   glipiZIDE 10 MG tablet Commonly known as: GLUCOTROL Take 10 mg by mouth daily with supper.   guaifenesin 400 MG Tabs tablet Commonly known as: HUMIBID E Take 400 mg by mouth every 6 (six) hours as needed (cough/congestion).   hydroxychloroquine 200 MG tablet Commonly known as: PLAQUENIL Take 200 mg by mouth daily.   Ipratropium-Albuterol 20-100 MCG/ACT Aers respimat Commonly known as: COMBIVENT Inhale 1 puff into the lungs every 6 (six) hours as needed for wheezing or shortness of breath.   metFORMIN 1000 MG tablet Commonly known as: GLUCOPHAGE Take 1,000 mg by mouth 2 (two) times daily with a meal.   methotrexate 2.5 MG tablet Commonly known as: RHEUMATREX Take 20 mg by mouth every Saturday.   nitroGLYCERIN 0.4 MG SL tablet Commonly known as: Nitrostat Place 1 tablet (0.4 mg total) under the tongue every 5 (five) minutes as needed for chest pain.   pantoprazole 40 MG tablet Commonly known as: PROTONIX Take 1 tablet (40 mg total) by mouth daily. Replaces: omeprazole 20 MG capsule   pioglitazone 30 MG tablet Commonly known as: ACTOS Take 30 mg by mouth daily.   predniSONE 5 MG tablet Commonly known as: DELTASONE Take 5 mg by mouth daily as needed (rheumatoid arthritis).   propranolol 10 MG tablet Commonly known as: INDERAL Take 10 mg by mouth 2 (two) times daily as needed (anxiety/PTSD).    rosuvastatin 5 MG tablet Commonly known as: Crestor Take 1 tablet (5 mg total) by mouth daily.          Outstanding Labs/Studies   Recommend lipid panel and LFTs at follow-up. Recommend repeat CBC at follow-up.   Duration of Discharge Encounter   Greater than 30 minutes including physician time.  Signed, Darreld Mclean, PA-C 06/20/2020, 9:31 AM

## 2020-06-20 NOTE — Progress Notes (Signed)
CARDIAC REHAB PHASE I   PRE:  Rate/Rhythm: 78 SR with PVC's  BP:  Supine:   Sitting: 128/64  Standing:    SaO2: 94 RA  MODE:  Ambulation: 390 ft   POST:  Rate/Rhythm: 91 SR  BP:  Supine:   Sitting: 115/70  Standing:    SaO2: 94 RA  Pt tolerated exercise well with standby assist and no assistive device. Pt amb 390 ft with no CP and slight SOB that he states is normal for his COPD. Education was given on radial restrictions,heart health diet, home exercise guidelines and Cardiac rehab phase 2 referral was placed for [facility]. Pt is apprehensive about starting the program for concerns with his PTSD. Reinforced aspirin, Plavix and NTG adherence. Pt voiced understanding and all questions were answered. Pt left in recliner with call bell in reach.   [id number] Kirby Funk ACSM-EP 06/20/2020 9:29 AM

## 2020-06-20 NOTE — Plan of Care (Signed)
  Problem: Education: Goal: Knowledge of General Education information will improve Description: Including pain rating scale, medication(s)/side effects and non-pharmacologic comfort measures Outcome: Adequate for Discharge   Problem: Health Behavior/Discharge Planning: Goal: Ability to manage health-related needs will improve Outcome: Adequate for Discharge   Problem: Clinical Measurements: Goal: Ability to maintain clinical measurements within normal limits will improve Outcome: Adequate for Discharge Goal: Will remain free from infection Outcome: Adequate for Discharge Goal: Diagnostic test results will improve Outcome: Adequate for Discharge Goal: Respiratory complications will improve Outcome: Adequate for Discharge Goal: Cardiovascular complication will be avoided Outcome: Adequate for Discharge   Problem: Activity: Goal: Risk for activity intolerance will decrease Outcome: Adequate for Discharge   Problem: Nutrition: Goal: Adequate nutrition will be maintained Outcome: Adequate for Discharge   Problem: Coping: Goal: Level of anxiety will decrease Outcome: Adequate for Discharge   Problem: Elimination: Goal: Will not experience complications related to bowel motility Outcome: Adequate for Discharge Goal: Will not experience complications related to urinary retention Outcome: Adequate for Discharge   Problem: Pain Managment: Goal: General experience of comfort will improve Outcome: Adequate for Discharge   Problem: Safety: Goal: Ability to remain free from injury will improve Outcome: Adequate for Discharge   Problem: Skin Integrity: Goal: Risk for impaired skin integrity will decrease Outcome: Adequate for Discharge   Problem: Education: Goal: Understanding of CV disease, CV risk reduction, and recovery process will improve Outcome: Adequate for Discharge Goal: Individualized Educational Video(s) Outcome: Adequate for Discharge   Problem:  Cardiovascular: Goal: Ability to achieve and maintain adequate cardiovascular perfusion will improve Outcome: Adequate for Discharge Goal: Vascular access site(s) Level 0-1 will be maintained Outcome: Adequate for Discharge

## 2020-06-20 NOTE — Discharge Instructions (Signed)
Medication Changes: - CONTINUE Aspirin 81mg  daily and Plavix 75mg  daily. These medications are very important and help keep the 2 new stents in your heart open. - START Rosuvastatin (Crestor) 5mg  daily. Let us know if you develop significant muscle aches with this. - STOP Omeprazole (Prilosec) and START Protonix 40mg  daily instead for reflux.  - You can restart you Metformin 48 hour after your cardiac catheterization which would be Sunday (06/22/2020) morning.  Post Cardiac Catheterization: NO HEAVY LIFTING OR SEXUAL ACTIVITY X 7 DAYS. NO DRIVING X 3-5 DAYS. NO SOAKING BATHS, HOT TUBS, POOLS, ETC., X 7 DAYS.  Radial Site Care: Refer to this sheet in the next few weeks. These instructions provide you with information on caring for yourself after your procedure. Your caregiver may also give you more specific instructions. Your treatment has been planned according to current medical practices, but problems sometimes occur. Call your caregiver if you have any problems or questions after your procedure. HOME CARE INSTRUCTIONS  You may shower the day after the procedure.Remove the bandage (dressing) and gently wash the site with plain soap and water.Gently pat the site dry.   Do not apply powder or lotion to the site.   Do not submerge the affected site in water for 3 to 5 days.   Inspect the site at least twice daily.   Do not flex or bend the affected arm for 24 hours.   No lifting over 5 pounds (2.3 kg) for 5 days after your procedure.   Do not drive home if you are discharged the same day of the procedure. Have someone else drive you.  What to expect:  Any bruising will usually fade within 1 to 2 weeks.   Blood that collects in the tissue (hematoma) may be painful to the touch. It should usually decrease in size and tenderness within 1 to 2 weeks.  SEEK IMMEDIATE MEDICAL CARE IF:  You have unusual pain at the radial site.   You have redness, warmth, swelling, or pain at the radial  site.   You have drainage (other than a small amount of blood on the dressing).   You have chills.   You have a fever or persistent symptoms for more than 72 hours.   You have a fever and your symptoms suddenly get worse.   Your arm becomes pale, cool, tingly, or numb.   You have heavy bleeding from the site. Hold pressure on the site.

## 2020-07-02 NOTE — Progress Notes (Signed)
Cardiology Office Note:   Date:  07/03/2020  NAME:  Bryan Kent    MRN: [id number] DOB:  Jun 23, 1950   PCP:  [facility]  Cardiologist:  Evalina Field, MD  Electrophysiologist:  None   Referring MD: [facility]   Chief Complaint  Patient presents with  . Coronary Artery Disease    History of Present Illness:   Bryan Kent is a 70 y.o. male with a hx of COPD, HLD, CAD, PAD, arthritis who presents for follow-up. Sent by [facility] for [place]. Underwent LHC with PCI to prox and mid LAD.  He is doing well after his stent.  Right radial cath site clean and dry.  He can start exercise slowly.  We reiterated the fact that he needs to take DAPT uninterrupted.  He will continue this for 6 months.  Further management will be per his cardiologist at the [facility].  We did go over the fact that he needs to work on his cholesterol.  He may need to be considered for Repatha.  Hopefully he will discuss this with his cardiologist at the [place].  We will give him copies of his paperwork.  He overall appears to be doing stable after his cardiac cath.  He is not having nearly as much cardiac ectopy as he was having.  I suspect this is all driven by his 22% mid LAD blockage.  Problem List 1. COPD -35 pack years  2. DM -A1c 9.8 3. PUD 4. Rheumatoid arthritis  5. CAD -Left heart cath 06/19/2020 -PCI to prox and mid LAD -40% RCA -Echo EF 55 to 02%, grade 1 diastolic dysfunction 6.  Hyperlipidemia -Total cholesterol 314, HDL 65, LDL 204, triglycerides 225 7. 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 Arthritis   . Cancer ([place])   . COPD (chronic obstructive pulmonary disease) ([place])   . Coronary artery disease   . Diabetes mellitus without complication ([place])   . Hyperlipidemia   . Hypertension   . PTSD (post-traumatic stress disorder)   . Rheumatoid  arthritis [facility])     Past Surgical History: Past Surgical History:  Procedure Laterality Date  . BACK SURGERY     fusion  . CORONARY ATHERECTOMY N/A 06/19/2020   Procedure: CORONARY ATHERECTOMY;  Surgeon: Lorretta Harp, MD;  Location: [place] CV LAB;  Service: Cardiovascular;  Laterality: N/A;  . CORONARY PRESSURE WIRE/FFR WITH 3D MAPPING N/A 06/19/2020   Procedure: Coronary Pressure Wire/FFR w/3D Mapping;  Surgeon: Lorretta Harp, MD;  Location: [place] CV LAB;  Service: Cardiovascular;  Laterality: N/A;  . CORONARY STENT INTERVENTION  06/19/2020  . CORONARY STENT INTERVENTION N/A 06/19/2020   Procedure: CORONARY STENT INTERVENTION;  Surgeon: Lorretta Harp, MD;  Location: [place] CV LAB;  Service: Cardiovascular;  Laterality: N/A;  . gastric ulcer    . INTRAVASCULAR IMAGING/OCT N/A 06/19/2020   Procedure: INTRAVASCULAR IMAGING/OCT;  Surgeon: Lorretta Harp, MD;  Location: [place] CV LAB;  Service: Cardiovascular;  Laterality: N/A;  . KNEE SURGERY    . LEFT HEART CATH AND CORONARY ANGIOGRAPHY N/A 06/19/2020   Procedure: LEFT HEART CATH AND CORONARY ANGIOGRAPHY;  Surgeon: Lorretta Harp, MD;  Location: [place] CV LAB;  Service: Cardiovascular;  Laterality: N/A;  . REPAIR OF PERFORATED ULCER    . ROTATOR CUFF REPAIR      Current  Medications: Current Meds  Medication Sig  . albuterol (ACCUNEB) 0.63 MG/3ML nebulizer solution Inhale 0.63 mg into the lungs in the morning and at bedtime.  . ALPRAZolam (XANAX) 0.5 MG tablet Take 0.5 mg by mouth 2 (two) times daily as needed for anxiety.  Marland Kitchen arformoterol (BROVANA) 15 MCG/2ML NEBU Take 15 mcg by nebulization 2 (two) times daily.  Marland Kitchen aspirin EC 81 MG tablet Take 81 mg by mouth daily. Swallow whole.  Marland Kitchen azithromycin (ZITHROMAX) 250 MG tablet Take 250 mg by mouth every Monday, Wednesday, and Friday.  . budesonide-formoterol (SYMBICORT) 80-4.5 MCG/ACT inhaler Inhale 1 puff into the lungs in the morning and at bedtime.  .  clopidogrel (PLAVIX) 75 MG tablet Take 1 tablet (75 mg total) by mouth daily.  . empagliflozin (JARDIANCE) 25 MG TABS tablet Take 25 mg by mouth daily.  . folic acid (FOLVITE) 1 MG tablet Take 1 mg by mouth daily.   Marland Kitchen glipiZIDE (GLUCOTROL) 10 MG tablet Take 10 mg by mouth daily with supper.  Marland Kitchen guaifenesin (HUMIBID E) 400 MG TABS tablet Take 400 mg by mouth every 6 (six) hours as needed (cough/congestion).  . hydroxychloroquine (PLAQUENIL) 200 MG tablet Take 200 mg by mouth daily.  . Ipratropium-Albuterol (COMBIVENT) 20-100 MCG/ACT AERS respimat Inhale 1 puff into the lungs every 6 (six) hours as needed for wheezing or shortness of breath.  . metFORMIN (GLUCOPHAGE) 1000 MG tablet Take 1,000 mg by mouth 2 (two) times daily with a meal.  . methotrexate (RHEUMATREX) 2.5 MG tablet Take 20 mg by mouth every Saturday.  . nitroGLYCERIN (NITROSTAT) 0.4 MG SL tablet Place 1 tablet (0.4 mg total) under the tongue every 5 (five) minutes as needed for chest pain.  . pantoprazole (PROTONIX) 40 MG tablet Take 1 tablet (40 mg total) by mouth daily.  . pioglitazone (ACTOS) 30 MG tablet Take 30 mg by mouth daily.   . predniSONE (DELTASONE) 5 MG tablet Take 5 mg by mouth daily as needed (rheumatoid arthritis).  . propranolol (INDERAL) 10 MG tablet Take 10 mg by mouth 2 (two) times daily as needed (anxiety/PTSD).  Marland Kitchen rosuvastatin (CRESTOR) 5 MG tablet Take 1 tablet (5 mg total) by mouth daily.     Allergies:    Lobster [shellfish allergy], Benzalkonium chloride, Pravastatin, Simvastatin, and Neosporin [bacitracin-polymyxin b]   Social History: Social History   Socioeconomic History  . Marital status: Single    Spouse name: Not on file  . Number of children: Not on file  . Years of education: Not on file  . Highest education level: Not on file  Occupational History  . Occupation: Retired - Geologist, engineering  . Occupation: [place] Veteran  Tobacco Use  . Smoking status: Former Smoker    Packs/day: 1.00     Years: 35.00    Pack years: 35.00  . Smokeless tobacco: Never Used  Vaping Use  . Vaping Use: Never used  Substance and Sexual Activity  . Alcohol use: Not Currently  . Drug use: Never  . Sexual activity: Not on file  Other Topics Concern  . Not on file  Social History Narrative  . Not on file   Social Determinants of Health   Financial Resource Strain: Not on file  Food Insecurity: Not on file  Transportation Needs: Not on file  Physical Activity: Not on file  Stress: Not on file  Social Connections: Not on file     Family History: The patient's family history includes Asthma in his mother; Heart disease in his  father.  ROS:   All other ROS reviewed and negative. Pertinent positives noted in the HPI.     EKGs/Labs/Other Studies Reviewed:   The following studies were personally reviewed by me today:  EKG:  EKG is ordered today.  The ekg ordered today demonstrates normal sinus rhythm heart rate 88, no acute ischemic changes or evidence of infarction, and was personally reviewed by me.   LHC 06/19/2020   Prox RCA to Mid RCA lesion is 40% stenosed.  Prox LAD lesion is 70% stenosed.  Mid LAD lesion is 95% stenosed.  A drug-eluting stent was successfully placed using a SYNERGY XD 2.75X28.  Post intervention, there is a 0% residual stenosis.  A drug-eluting stent was successfully placed using a SYNERGY XD 3.50X16.  Post intervention, there is a 0% residual stenosis.  The left ventricular systolic function is normal.  LV end diastolic pressure is normal.  The left ventricular ejection fraction is 50-55% by visual estimate.    Recent Labs: 06/20/2020: BUN 13; Creatinine, Ser 0.77; Hemoglobin 12.9; Platelets 276; Potassium 4.1; Sodium 136   Recent Lipid Panel No results found for: CHOL, TRIG, HDL, CHOLHDL, VLDL, LDLCALC, LDLDIRECT  Physical Exam:   VS:  BP 132/68 (BP Location: Left Arm, Patient Position: Sitting, Cuff Size: Normal)   Pulse 88   Ht 5\' 8"  (1.727  m)   Wt 179 lb (81.2 kg)   BMI 27.22 kg/m    Wt Readings from Last 3 Encounters:  07/03/20 179 lb (81.2 kg)  06/19/20 180 lb (81.6 kg)  06/11/20 180 lb 12.8 oz (82 kg)    General: Well nourished, well developed, in no acute distress Head: Atraumatic, normal size  Eyes: PEERLA, EOMI  Neck: Supple, no JVD Endocrine: No thryomegaly Cardiac: Normal S1, S2; RRR; no murmurs, rubs, or gallops Lungs: Clear to auscultation bilaterally, no wheezing, rhonchi or rales  Abd: Soft, nontender, no hepatomegaly  Ext: No edema, pulses 2+ Musculoskeletal: No deformities, BUE and BLE strength normal and equal Skin: Warm and dry, no rashes   Neuro: Alert and oriented to person, place, time, and situation, CNII-XII grossly intact, no focal deficits  Psych: Normal mood and affect   ASSESSMENT:   Bryan Kent is a 70 y.o. male who presents for the following: 1. Coronary artery disease of native artery of native heart with stable angina pectoris ([place])   2. Mixed hyperlipidemia     PLAN:   1. Coronary artery disease of native artery of native heart with stable angina pectoris ([place]) 2. Mixed hyperlipidemia -He was sent from the [facility] cardiac cath.  Underwent atherectomy and PCI to mid LAD.  He will continue DAPT unerupted for 6 months.  We will give him his discharge summary and cath report to take to the [place].  We do not want the paperwork to be lost.  I have recommended him continue Crestor 5 mg daily.  He will possibly need to be considered for PCSK9 inhibitor therapy.  This can be evaluated at the [facility].  He will see [place] as needed moving forward.  Overall appears to be doing well and stable after his cardiac cath.  Cath site is clean and dry.  Disposition: Return if symptoms worsen or fail to improve.  Medication Adjustments/Labs and Tests Ordered: Current medicines are reviewed at length with the patient today.  Concerns regarding medicines are outlined above.  Orders Placed This  Encounter  Procedures  . EKG 12-Lead   No orders of the defined types  were placed in this encounter.   Patient Instructions  Medication Instructions:  No Changes In Medications at this time.  *If you need a refill on your cardiac medications before your next appointment, please call your pharmacy*  Follow-Up: At [facility], you and your health needs are our priority.  As part of our continuing mission to provide you with exceptional heart care, we have created designated Provider Care Teams.  These Care Teams include your primary Cardiologist (physician) and Advanced Practice Providers (APPs -  Physician Assistants and Nurse Practitioners) who all work together to provide you with the care you need, when you need it.  We recommend signing up for the patient portal called "MyChart".  Sign up information is provided on this After Visit Summary.  MyChart is used to connect with patients for Virtual Visits (Telemedicine).  Patients are able to view lab/test results, encounter notes, upcoming appointments, etc.  Non-urgent messages can be sent to your provider as well.   To learn more about what you can do with MyChart, go to [email or web address].    Your next appointment:   AS NEEDED   The format for your next appointment:   In Person  Provider:   Eleonore Chiquito, MD     Time Spent with Patient: I have spent a total of 25 minutes with patient reviewing hospital notes, telemetry, EKGs, labs and examining the patient as well as establishing an assessment and plan that was discussed with the patient.  > 50% of time was spent in direct patient care.  Signed, Addison Naegeli. Audie Box, MD, [place]  [address] [place], [place] [address] [phone]  07/03/2020 10:56 AM

## 2020-07-03 ENCOUNTER — Encounter: Payer: Self-pay | Admitting: Cardiovascular Disease

## 2020-07-03 ENCOUNTER — Ambulatory Visit (INDEPENDENT_AMBULATORY_CARE_PROVIDER_SITE_OTHER): Payer: No Typology Code available for payment source | Admitting: Cardiovascular Disease

## 2020-07-03 ENCOUNTER — Other Ambulatory Visit: Payer: Self-pay

## 2020-07-03 VITALS — BP 132/68 | HR 88 | Ht 68.0 in | Wt 179.0 lb

## 2020-07-03 DIAGNOSIS — I25118 Atherosclerotic heart disease of native coronary artery with other forms of angina pectoris: Secondary | ICD-10-CM | POA: Diagnosis not present

## 2020-07-03 DIAGNOSIS — E782 Mixed hyperlipidemia: Secondary | ICD-10-CM

## 2020-07-03 NOTE — Patient Instructions (Signed)
Medication Instructions:  No Changes In Medications at this time.  *If you need a refill on your cardiac medications before your next appointment, please call your pharmacy*  Follow-Up: At [facility], you and your health needs are our priority.  As part of our continuing mission to provide you with exceptional heart care, we have created designated Provider Care Teams.  These Care Teams include your primary Cardiologist (physician) and Advanced Practice Providers (APPs -  Physician Assistants and Nurse Practitioners) who all work together to provide you with the care you need, when you need it.  We recommend signing up for the patient portal called "MyChart".  Sign up information is provided on this After Visit Summary.  MyChart is used to connect with patients for Virtual Visits (Telemedicine).  Patients are able to view lab/test results, encounter notes, upcoming appointments, etc.  Non-urgent messages can be sent to your provider as well.   To learn more about what you can do with MyChart, go to [email or web address].    Your next appointment:   AS NEEDED   The format for your next appointment:   In Person  Provider:   Eleonore Chiquito, MD

## 2020-10-28 ENCOUNTER — Other Ambulatory Visit (HOSPITAL_COMMUNITY): Payer: Self-pay | Admitting: Neurological Surgery

## 2020-10-28 DIAGNOSIS — M5126 Other intervertebral disc displacement, lumbar region: Secondary | ICD-10-CM

## 2020-11-07 ENCOUNTER — Ambulatory Visit (HOSPITAL_COMMUNITY)
Admission: RE | Admit: 2020-11-07 | Discharge: 2020-11-07 | Disposition: A | Discharge status: Home / Self Care | Payer: No Typology Code available for payment source | Source: Ambulatory Visit | Attending: Neurological Surgery | Admitting: Neurological Surgery

## 2020-11-07 ENCOUNTER — Other Ambulatory Visit: Payer: Self-pay

## 2020-11-07 DIAGNOSIS — M5126 Other intervertebral disc displacement, lumbar region: Secondary | ICD-10-CM | POA: Insufficient documentation

## 2020-11-07 IMAGING — MR MR LUMBAR SPINE W/O CM
5 series · 31 of 48 positions shown · non-contrast
Comparison: [DATE]

CLINICAL DATA: Lower back pain radiating down the left leg

EXAM:
MRI LUMBAR SPINE WITHOUT CONTRAST
TECHNIQUE: Multiplanar, multisequence MR imaging of the lumbar spine was
performed. No intravenous contrast was administered.

[Series 5: T2 · sagittal · 4.0mm · 0.68mm/px · 8 of 16 slices shown (1 of 2)]
[im 1/16]
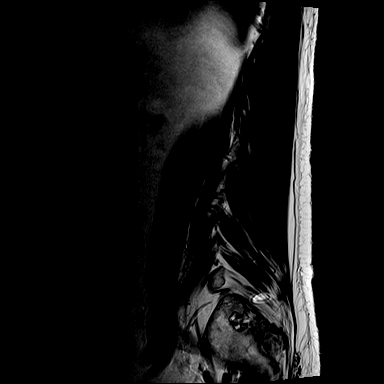
[im 3/16]
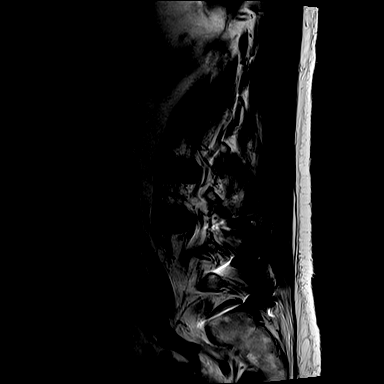
[im 5/16]
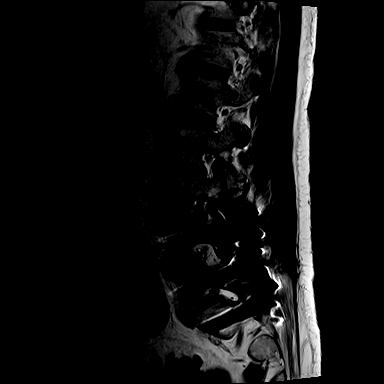
[im 7/16]
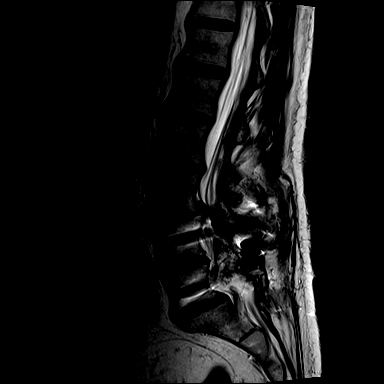
[im 9/16]
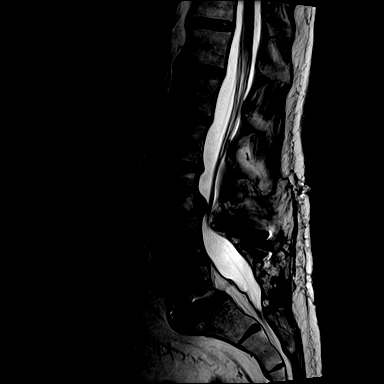
[im 11/16]
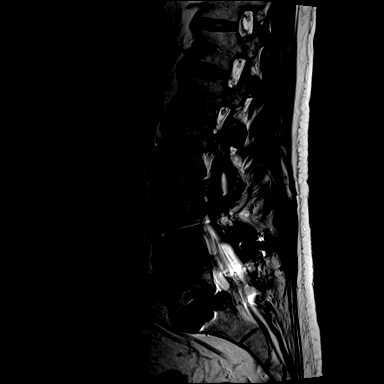
[im 13/16]
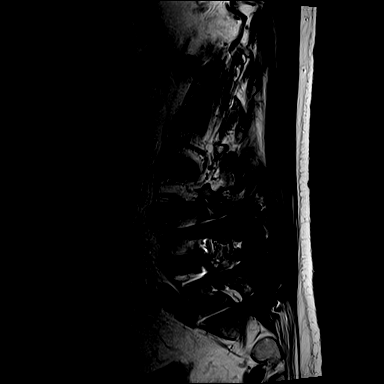
[im 16/16]
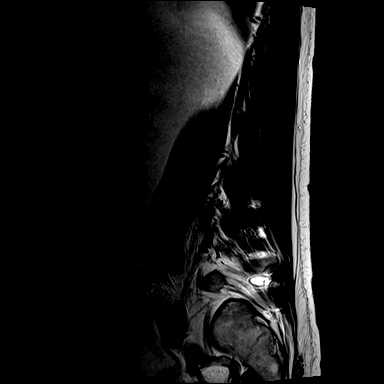

[Series 6: T1 · sagittal · 4.0mm · 0.81mm/px · 6 of 15 slices shown (1 of 2)]
[im 1/15]
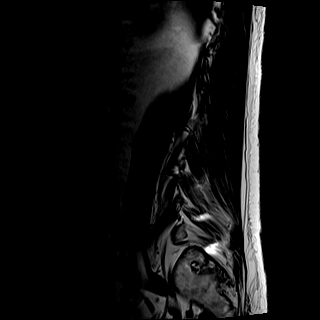
[im 3/15]
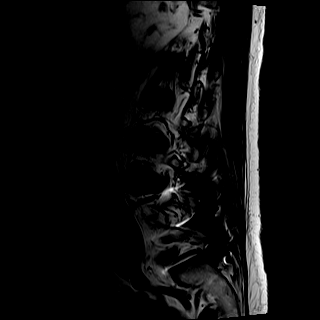
[im 6/15]
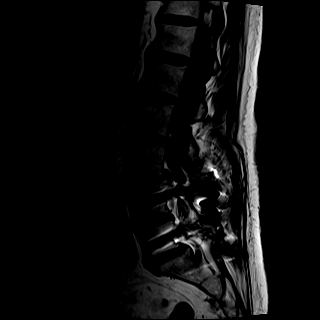
[im 9/15]
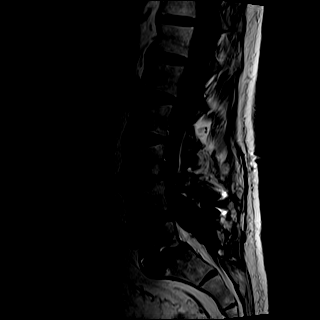
[im 12/15]
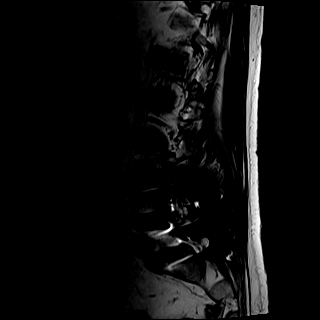
[im 15/15]
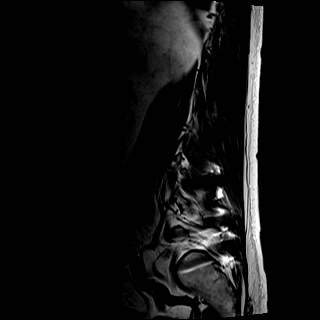

[Series 7: STIR · sagittal · 4.0mm · 0.51mm/px · 1 of 15 slices shown]
[im 1/15]
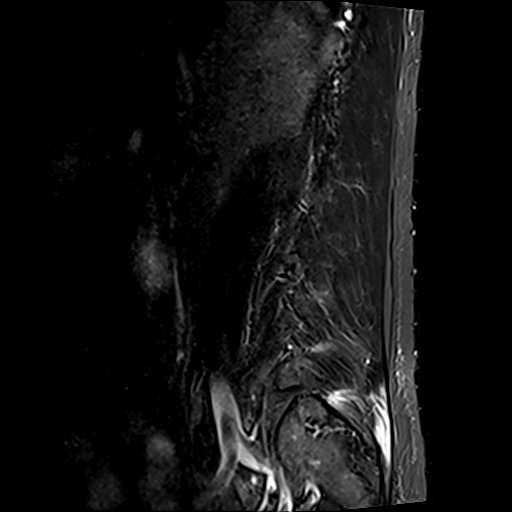

[Series 8: T2 · axial · 4.0mm · 0.70mm/px · z∈[-43,+167]mm · 8 of 33 slices shown (2 of 2)]
[im 1/33]
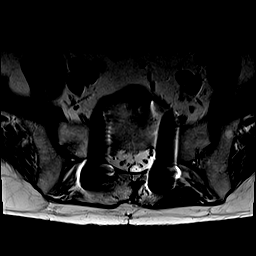
[im 5/33]
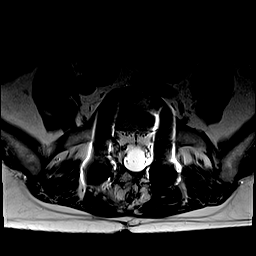
[im 10/33]
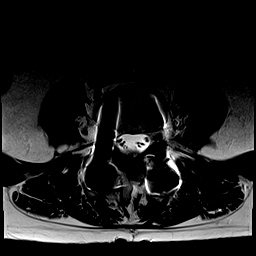
[im 15/33]
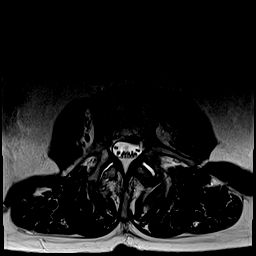
[im 18/33]
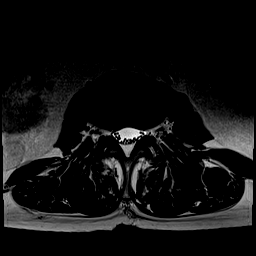
[im 23/33]
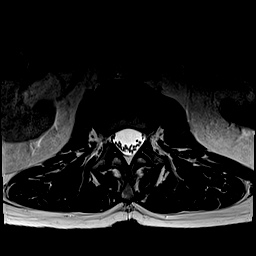
[im 28/33]
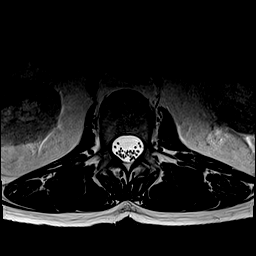
[im 33/33]
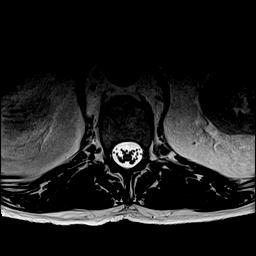

[Series 9: T1 · axial · 4.0mm · 0.35mm/px · z∈[-43,+167]mm · 8 of 33 slices shown (2 of 2)]
[im 1/33]
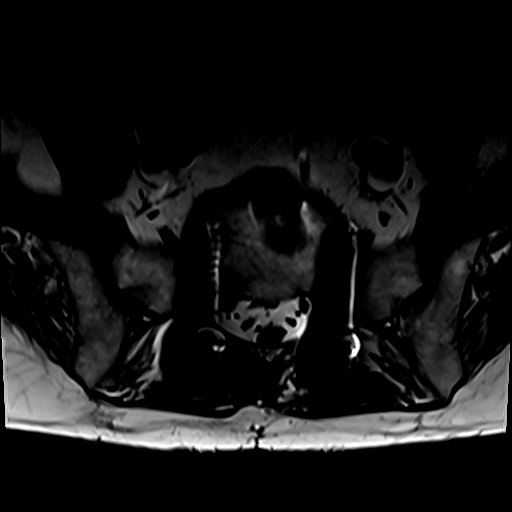
[im 5/33]
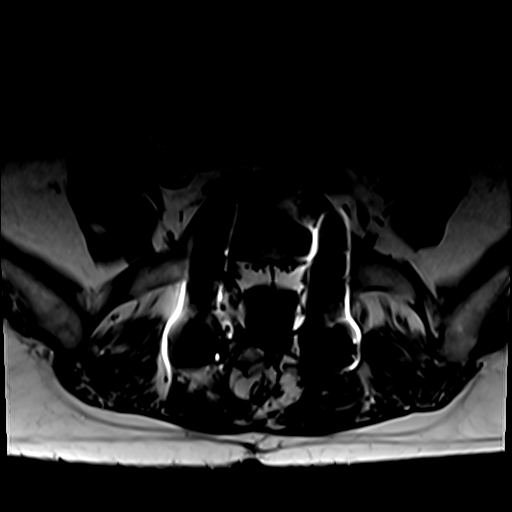
[im 10/33]
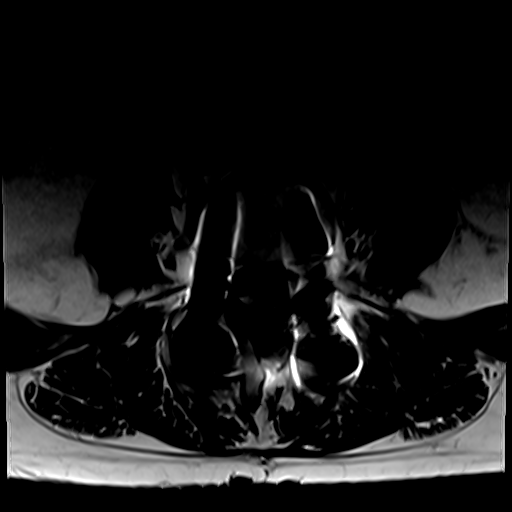
[im 15/33]
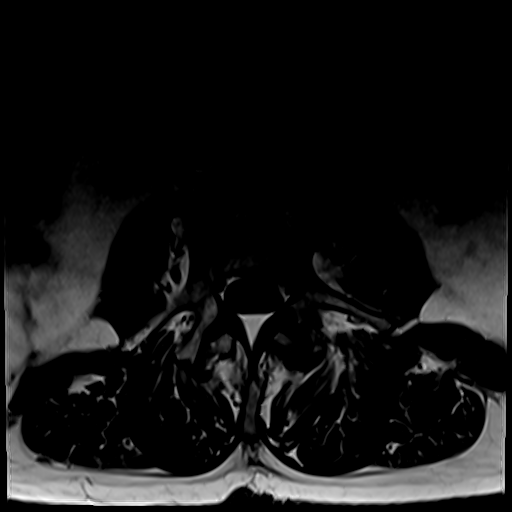
[im 18/33]
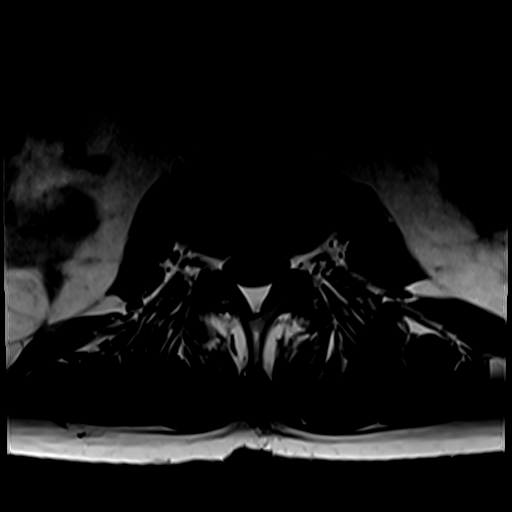
[im 23/33]
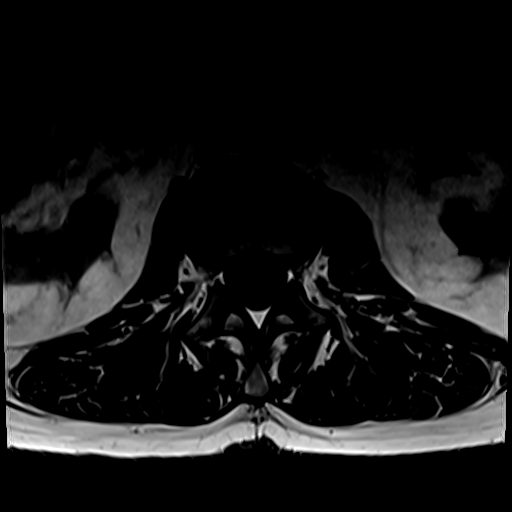
[im 28/33]
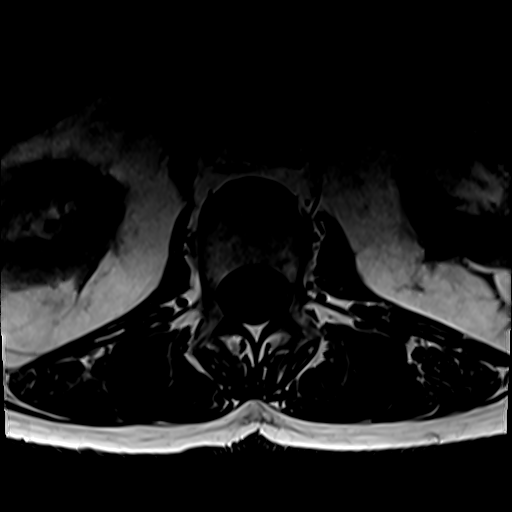
[im 33/33]
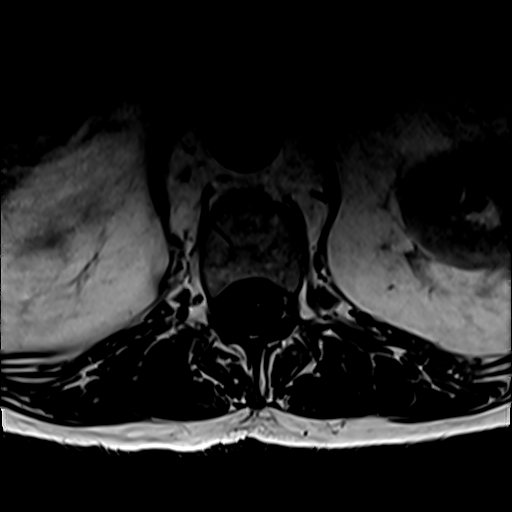

[31 of 48 positions shown; findings below may reference images not displayed]

FINDINGS: Segmentation:  Standard.

Alignment:  Anterolisthesis at L3-4, L4-5 and L5-S1

Vertebrae:  No fracture, evidence of discitis, or bone lesion.

Conus medullaris and cauda equina: Conus extends to the T12-L1
level. T2 hyperintensity with thinning in the distal cord the level
of the T11-12 disc space, interval

Paraspinal and other soft tissues: Aneurysmal right common common
iliac artery was also seen previously, up to 2.5 cm in diameter.
Colonic diverticulosis.

Disc levels:

T12- L1: Unremarkable.

L1-L2: Unremarkable.

L2-L3: Disc narrowing and bulging with degenerative facet spurring.
Fluid-filled facet joints bilaterally.

L3-L4: Facet osteoarthritis with spurring and anterolisthesis. Disc
narrowing and bulging with large left eccentric herniation that is
severely compressive at the left foramen, up lifting and compressing
the left L3 nerve root. Advanced spinal stenosis.

L4-L5: PLIF.  No impingement

L5-S1:PLIF.  No neural compression.
IMPRESSION: 1. L3-4 advanced adjacent segment degeneration with anterolisthesis
and chronic left eccentric herniation which compresses the L3 nerve
root the foramen and causes advanced spinal stenosis.
2. L4-S1 PLIF without impingement.
3. Area of apparent myelomalacia in the lower cord at T11-12, not
seen in [CS], question myelopathy.
4. Common iliac artery aneurysm on the right unchanged dimensions
from [CS] (2.5 cm).

## 2020-11-28 ENCOUNTER — Other Ambulatory Visit (HOSPITAL_COMMUNITY): Payer: Self-pay | Admitting: Neurological Surgery

## 2020-11-28 ENCOUNTER — Other Ambulatory Visit: Payer: Self-pay | Admitting: Neurological Surgery

## 2020-11-28 DIAGNOSIS — M5126 Other intervertebral disc displacement, lumbar region: Secondary | ICD-10-CM

## 2021-01-28 ENCOUNTER — Other Ambulatory Visit: Payer: Self-pay

## 2021-01-28 ENCOUNTER — Ambulatory Visit (HOSPITAL_COMMUNITY)
Admission: RE | Admit: 2021-01-28 | Discharge: 2021-01-28 | Disposition: A | Discharge status: Home / Self Care | Payer: No Typology Code available for payment source | Source: Ambulatory Visit | Attending: Neurological Surgery | Admitting: Neurological Surgery

## 2021-01-28 DIAGNOSIS — M5126 Other intervertebral disc displacement, lumbar region: Secondary | ICD-10-CM | POA: Insufficient documentation

## 2021-01-28 IMAGING — MR MR LUMBAR SPINE W/O CM
5 series · 31 of 48 positions shown · non-contrast
Comparison: MRI of the lumbar spine [DATE].

CLINICAL DATA: Herniated nucleus pulposis, L3-4.

EXAM:
MRI LUMBAR SPINE WITHOUT CONTRAST
TECHNIQUE: Multiplanar, multisequence MR imaging of the lumbar spine was
performed. No intravenous contrast was administered.

[Series 5: T2 · sagittal · 4.0mm · 0.68mm/px · 7 of 15 slices shown (1 of 2)]
[im 1/15]
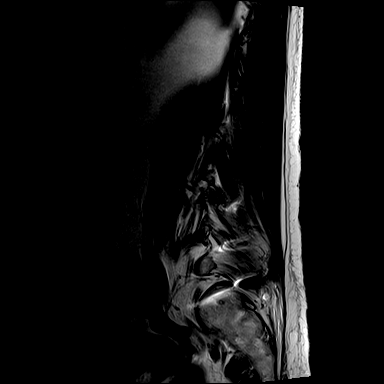
[im 3/15]
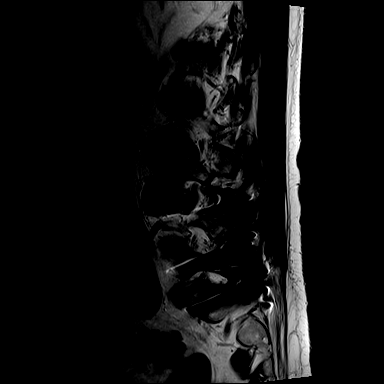
[im 5/15]
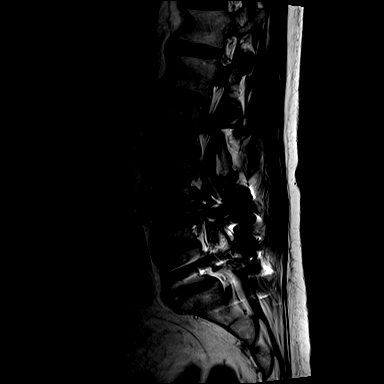
[im 8/15]
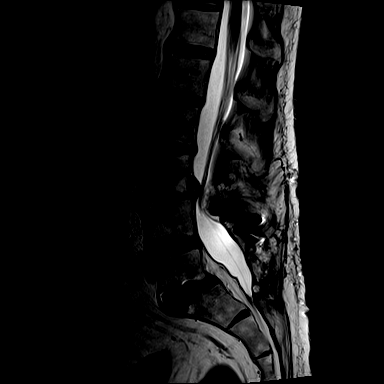
[im 10/15]
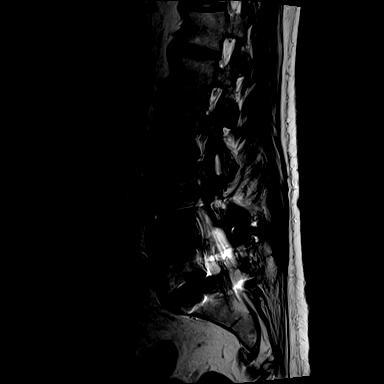
[im 12/15]
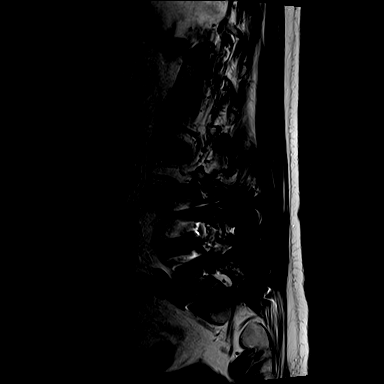
[im 15/15]
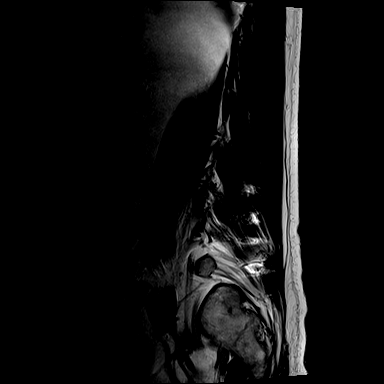

[Series 6: T1 · sagittal · 4.0mm · 0.81mm/px · 7 of 15 slices shown (1 of 2)]
[im 1/15]
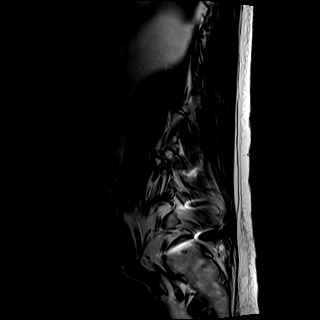
[im 3/15]
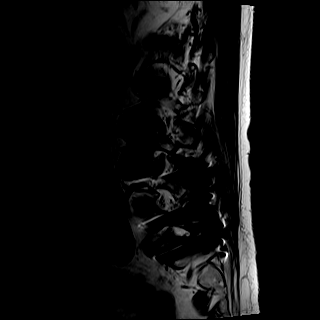
[im 5/15]
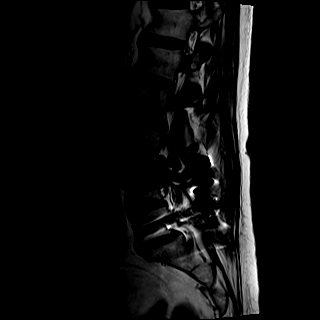
[im 8/15]
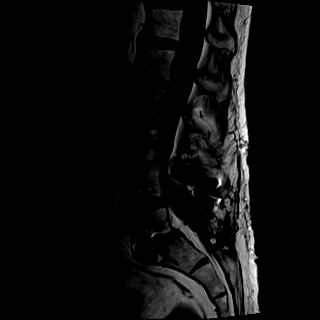
[im 10/15]
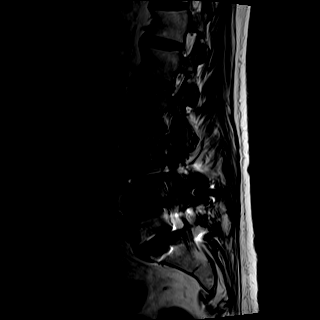
[im 12/15]
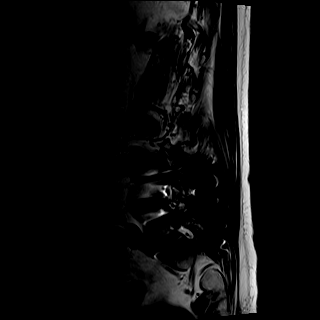
[im 15/15]
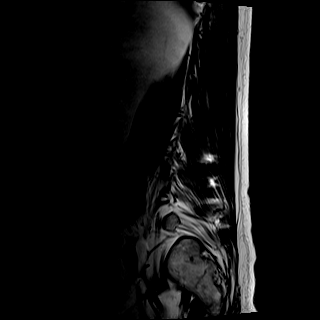

[Series 7: STIR · sagittal · 4.0mm · 0.51mm/px · 1 of 15 slices shown]
[im 1/15]
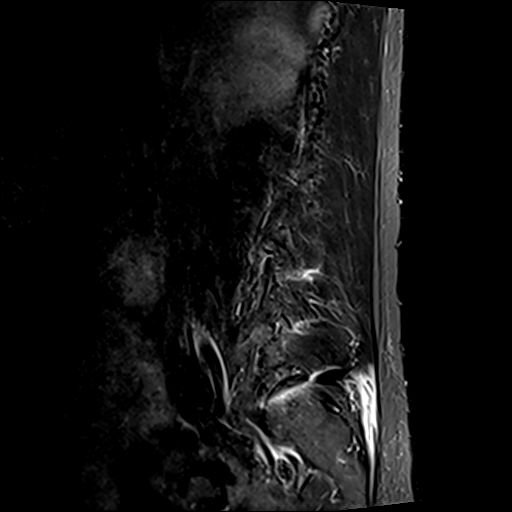

[Series 8: T2 · axial · 4.0mm · 0.70mm/px · z∈[-146,+47]mm · 8 of 33 slices shown (2 of 2)]
[im 1/33]
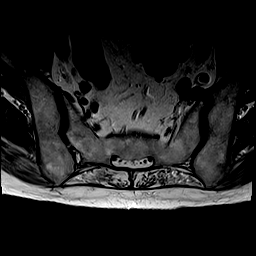
[im 5/33]
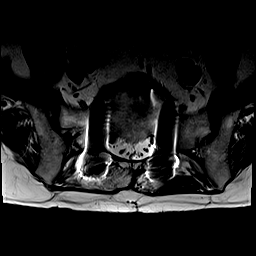
[im 10/33]
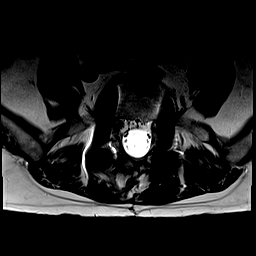
[im 15/33]
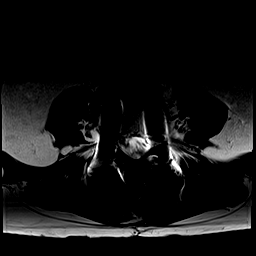
[im 18/33]
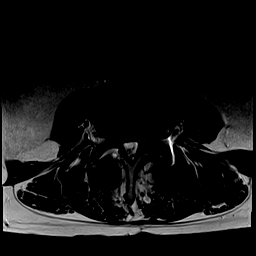
[im 23/33]
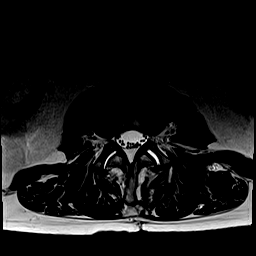
[im 28/33]
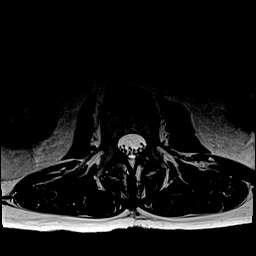
[im 33/33]
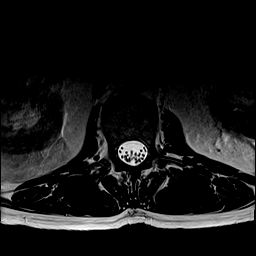

[Series 9: T1 · axial · 4.0mm · 0.35mm/px · z∈[-146,+47]mm · 8 of 33 slices shown (2 of 2)]
[im 1/33]
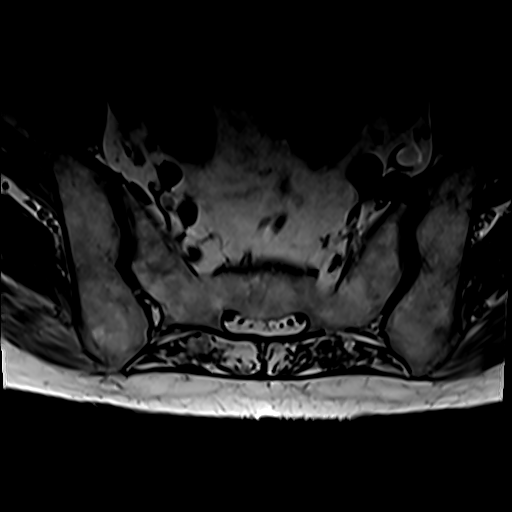
[im 5/33]
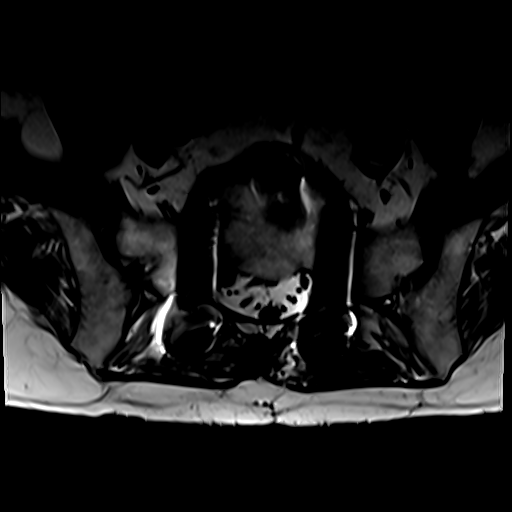
[im 10/33]
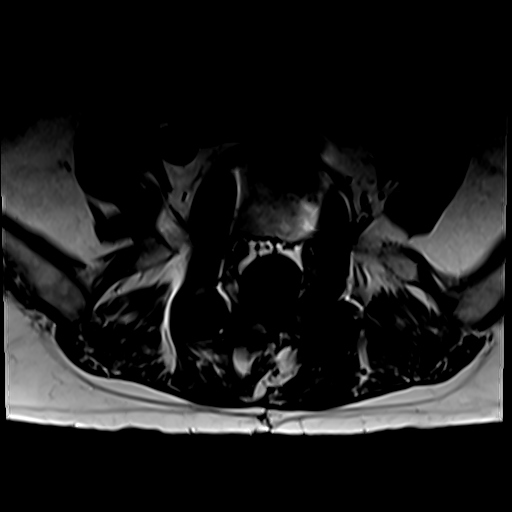
[im 15/33]
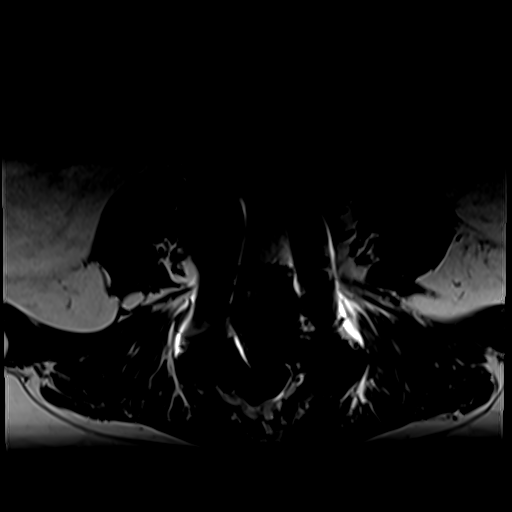
[im 18/33]
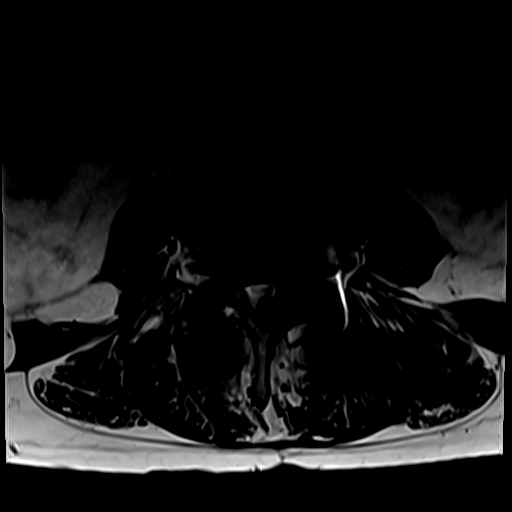
[im 23/33]
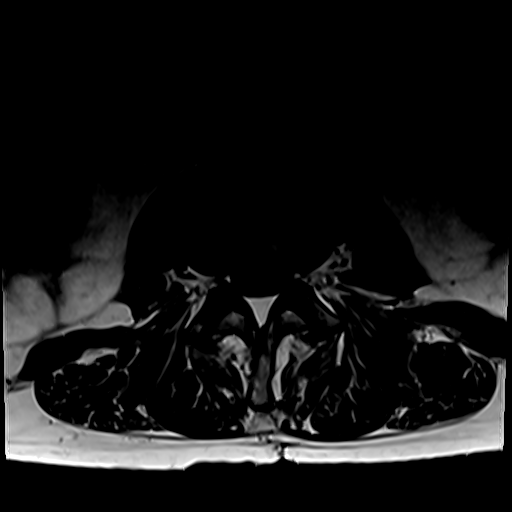
[im 28/33]
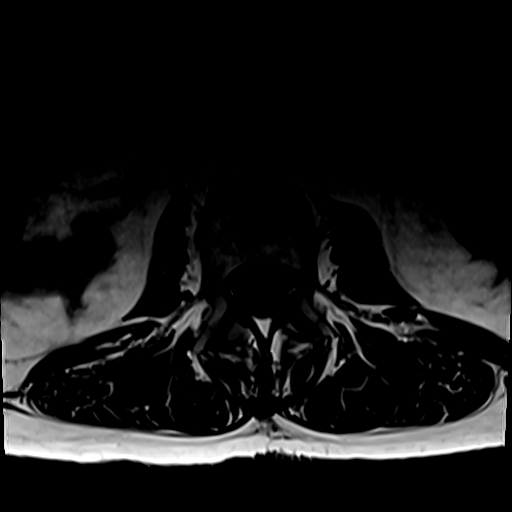
[im 33/33]
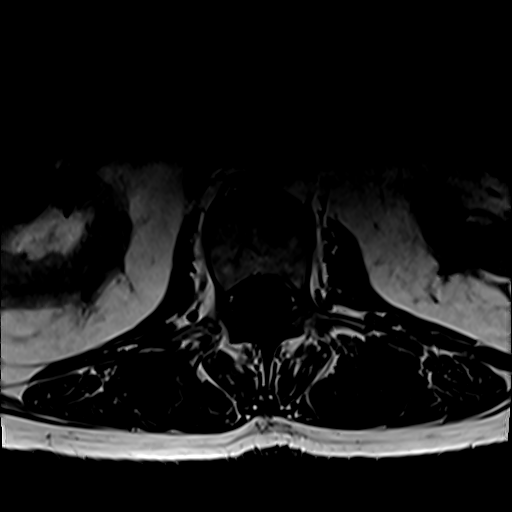

[31 of 48 positions shown; findings below may reference images not displayed]

FINDINGS: Segmentation:  Standard.

Alignment: Grade 1 anterolisthesis at L3-4, L4-5 and L5-S1 appear
stable.

Vertebrae: No fracture, evidence of discitis, or bone lesion.
Postsurgical changes from decompression and fusion from L4 through
S1.

Conus medullaris and cauda equina: Conus extends to the T12-L1
level. Conus and cauda equina appear normal.

Paraspinal and other soft tissues: Right common iliac artery
aneurysm, measuring 2.4 cm, stable. Colonic diverticulosis.

Disc levels:

T12-L1: No spinal canal or neural foraminal stenosis.

L1-2: No spinal canal or neural foraminal stenosis.

L2-3: Shallow disc bulge, moderate facet degenerative changes with
bilateral joint without significant spinal canal or neural foraminal
stenosis.

L3-4: Loss of disc height, disc bulge/disc uncovering with
superimposed left subarticular/foraminal superiorly migrating disc
extrusion, hypertrophic facet degenerative changes and ligamentum
flavum redundancy. Findings result in hand ear spinal canal
stenosis, moderate to severe right and severe left neural foraminal
narrowing, unchanged from prior MRI.

L4-5: No spinal canal or neural foraminal stenosis.

L5-S1: No spinal canal or neural foraminal stenosis.
IMPRESSION: 1. No significant interval change of the facet osteoarthritis and
degenerative disc disease at L3-4 with left eccentric, superiorly
migrating disc extrusion resulting in severe spinal canal stenosis,
moderate to severe right and severe left neural foraminal narrowing.
2. No high-grade spinal canal or neural foraminal stenosis in the
other lumbar levels.
3. Stable right common iliac artery aneurysm.

## 2021-02-06 ENCOUNTER — Telehealth: Payer: Self-pay | Admitting: *Deleted

## 2021-02-06 NOTE — Telephone Encounter (Signed)
I left a message for the patient to return my call about scheduling an appointment for pre-op clearance.

## 2021-02-06 NOTE — Telephone Encounter (Signed)
Primary Cardiologist:Bryan Doreatha Lew, MD  Chart reviewed as part of pre-operative protocol coverage. Because of Bryan Kent's past medical history and time since last visit, he/she will require a follow-up visit in order to better assess preoperative cardiovascular risk.  Pre-op covering staff: - Please schedule appointment and call patient to inform them. - Please contact requesting surgeon's office via preferred method (i.e, phone, fax) to inform them of need for appointment prior to surgery.  If applicable, this message will also be routed to pharmacy pool and/or primary cardiologist for input on holding anticoagulant/antiplatelet agent as requested below so that this information is available at time of patient's appointment.   Deberah Pelton, NP  02/06/2021, 2:21 PM

## 2021-02-06 NOTE — Telephone Encounter (Signed)
   [facility] Pre-operative Risk Assessment    Patient Name: Bryan Kent  DOB: [birth year] MRN: [id number]  HEARTCARE STAFF:  - IMPORTANT!!!!!! Under Visit Info/Reason for Call, type in Other and utilize the format Clearance MM/DD/YY or Clearance TBD. Do not use dashes or single digits. - Please review there is not already an duplicate clearance open for this procedure. - If request is for dental extraction, please clarify the # of teeth to be extracted. - If the patient is currently at the dentist's office, call Pre-Op Callback Staff (MA/nurse) to input urgent request.  - If the patient is not currently in the dentist office, please route to the Pre-Op pool.  Request for surgical clearance:  What type of surgery is being performed? LUMBAR FUSION  When is this surgery scheduled? TBD  What type of clearance is required (medical clearance vs. Pharmacy clearance to hold med vs. Both)? MEDICAL  Are there any medications that need to be held prior to surgery and how long? NONE  Practice name and name of physician performing surgery? [facility]  What is the office phone number? [phone]   7.   What is the office fax number? [phone]  8.   Anesthesia type (None, local, MAC, general) ? GENERAL    Devra Dopp 02/06/2021, 2:07 PM  _________________________________________________________________   (provider comments below)

## 2021-02-06 NOTE — Telephone Encounter (Signed)
Pt has an appointment with Coletta Memos, NP on 11/9 at 8:50 am. Will fax over recommendations to requesting surgeon's office.

## 2021-02-25 ENCOUNTER — Ambulatory Visit: Payer: Non-veteran care | Admitting: General Practice

## 2021-03-03 ENCOUNTER — Other Ambulatory Visit: Payer: Self-pay

## 2021-03-03 ENCOUNTER — Ambulatory Visit (INDEPENDENT_AMBULATORY_CARE_PROVIDER_SITE_OTHER): Payer: No Typology Code available for payment source | Admitting: Pulmonary Disease

## 2021-03-03 ENCOUNTER — Encounter: Payer: Self-pay | Admitting: Pulmonary Disease

## 2021-03-03 VITALS — BP 134/82 | HR 98 | Temp 98.4°F | Ht 68.0 in | Wt 170.0 lb

## 2021-03-03 DIAGNOSIS — Z01811 Encounter for preprocedural respiratory examination: Secondary | ICD-10-CM

## 2021-03-03 DIAGNOSIS — J432 Centrilobular emphysema: Secondary | ICD-10-CM | POA: Diagnosis not present

## 2021-03-03 MED ORDER — IPRATROPIUM-ALBUTEROL 20-100 MCG/ACT IN AERS: 1.0000 | INHALATION_SPRAY | Freq: Four times a day (QID) | RESPIRATORY_TRACT | 5 refills | Status: AC | PRN

## 2021-03-03 NOTE — Progress Notes (Signed)
[facility]  Chief Complaint  Patient presents with   Consult    COPD consult.     Past Surgical History:  He  has a past surgical history that includes Knee surgery; gastric ulcer; CORONARY STENT INTERVENTION (06/19/2020); Rotator cuff repair; Back surgery; Repair of perforated ulcer; LEFT HEART CATH AND CORONARY ANGIOGRAPHY (N/A, 06/19/2020); INTRAVASCULAR IMAGING/OCT (N/A, 06/19/2020); Coronary Pressure Wire/FFR w/3D Mapping (N/A, 06/19/2020); CORONARY ATHERECTOMY (N/A, 06/19/2020); and CORONARY STENT INTERVENTION (N/A, 06/19/2020).  Past Medical History:  OA, CAD, DM type 2, HLD, PTSD, HTN, RA, PUD  Constitutional:  BP 134/82 (BP Location: Left Arm, Patient Position: Sitting)   Pulse 98   Temp 98.4 F (36.9 C) (Temporal)   Ht 5\' 8"  (1.727 m)   Wt 170 lb 0.6 oz (77.1 kg)   SpO2 96% Comment: ra  BMI 25.85 kg/m   Brief Summary:  Bryan Kent is a 70 y.o. male former smoker with COPD.  He is veteran from [place]  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      Subjective:   He is followed by neurosurgery.  He is scheduled to have back surgery with Dr. Elwin Sleight in January 2023.  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  His provider at the [place] is out on medical leave.  As such he had referral today for pulmonary assessment prior to having back surgery.  He quit smoking in 2016.  He gets cough and congestion when he is outside for prolonged periods.  He is fairly active, and doesn't feel that his breathing limits his activity level.  He is not having cough, wheeze, sputum, chest pain, or hemoptysis.  No issues with his breathing while asleep.  No prior history of pneumonia or tuberculosis.  He has rheumatoid arthritis and is maintained on methotrexate and plaquenil.  He intermittently uses prednisone when his joints flare up.  He uses brovana daily.  He uses combivent  intermittently.  He sporadically uses symbicort.    Physical Exam:   Appearance - well kempt   ENMT - no sinus tenderness, no oral exudate, no LAN, Mallampati 3 airway, no stridor  Respiratory - equal breath sounds bilaterally, no wheezing or rales  CV - s1s2 regular rate and rhythm, no murmurs  Ext - no clubbing, no edema  Skin - no rashes  Psych - normal mood and affect   Chest Imaging:  CT chest 08/19/20 >> centrilobular emphysema, 1.2 x 0.5 cm density LUL unchanged since August 2020, b/l gynecomastia, coronary calcifications   Social History:  He  reports that he has quit smoking. His smoking use included cigarettes. He has a 35.00 pack-year smoking history. He has never used smokeless tobacco. He reports that he does not currently use alcohol. He reports that he does not use drugs.  Family History:  His family history includes Asthma in his mother; Heart disease in his father.     Assessment/Plan:   COPD from emphysema. - will maintained on his current regimen - no additional testing needed at this time - he plans to resume follow up at the [facility]  Back pain. - he is followed by Dr. Pieter Partridge Dawley with neurosurgery - no pulmonary contraindications for him to proceed with surgery  Time Spent Involved in Patient Care on Day of Examination:  47 minutes  Follow up:   Patient Instructions  Okay to proceed with surgery  Follow up as needed  Medication List:   Allergies as of 03/03/2021       Reactions   Lobster [shellfish Allergy] Hives   Benzalkonium Chloride Rash   Pravastatin Rash   Simvastatin Rash   Neosporin [bacitracin-polymyxin B]         Medication List        Accurate as of March 03, 2021 10:34 AM. If you have any questions, ask your nurse or doctor.          albuterol 0.63 MG/3ML nebulizer solution Commonly known as: ACCUNEB Inhale 0.63 mg into the lungs in the morning and at bedtime.   ALPRAZolam 0.5 MG tablet Commonly known as:  XANAX Take 0.5 mg by mouth 2 (two) times daily as needed for anxiety.   arformoterol 15 MCG/2ML Nebu Commonly known as: BROVANA Take 15 mcg by nebulization 2 (two) times daily.   aspirin EC 81 MG tablet Take 81 mg by mouth daily. Swallow whole.   azithromycin 250 MG tablet Commonly known as: ZITHROMAX Take 250 mg by mouth every Monday, Wednesday, and Friday.   budesonide-formoterol 80-4.5 MCG/ACT inhaler Commonly known as: SYMBICORT Inhale 1 puff into the lungs in the morning and at bedtime.   clopidogrel 75 MG tablet Commonly known as: PLAVIX Take 1 tablet (75 mg total) by mouth daily.   empagliflozin 25 MG Tabs tablet Commonly known as: JARDIANCE Take 25 mg by mouth daily.   folic acid 1 MG tablet Commonly known as: FOLVITE Take 1 mg by mouth daily.   glipiZIDE 10 MG tablet Commonly known as: GLUCOTROL Take 10 mg by mouth daily with supper.   guaifenesin 400 MG Tabs tablet Commonly known as: HUMIBID E Take 400 mg by mouth every 6 (six) hours as needed (cough/congestion).   hydroxychloroquine 200 MG tablet Commonly known as: PLAQUENIL Take 200 mg by mouth daily.   Ipratropium-Albuterol 20-100 MCG/ACT Aers respimat Commonly known as: COMBIVENT Inhale 1 puff into the lungs every 6 (six) hours as needed for wheezing or shortness of breath.   metFORMIN 1000 MG tablet Commonly known as: GLUCOPHAGE Take 1,000 mg by mouth 2 (two) times daily with a meal.   methotrexate 2.5 MG tablet Commonly known as: RHEUMATREX Take 20 mg by mouth every Saturday.   nitroGLYCERIN 0.4 MG SL tablet Commonly known as: Nitrostat Place 1 tablet (0.4 mg total) under the tongue every 5 (five) minutes as needed for chest pain.   nitroGLYCERIN 0.4 MG SL tablet Commonly known as: NITROSTAT PLACE 1 TABLET (0.4 MG TOTAL) UNDER THE TONGUE EVERY FIVE MINUTES AS NEEDED FOR CHEST PAIN.   pantoprazole 40 MG tablet Commonly known as: PROTONIX Take 1 tablet (40 mg total) by mouth daily.    pantoprazole 40 MG tablet Commonly known as: PROTONIX TAKE 1 TABLET (40 MG TOTAL) BY MOUTH DAILY.   pioglitazone 30 MG tablet Commonly known as: ACTOS Take 30 mg by mouth daily.   predniSONE 5 MG tablet Commonly known as: DELTASONE Take 5 mg by mouth daily as needed (rheumatoid arthritis).   propranolol 10 MG tablet Commonly known as: INDERAL Take 10 mg by mouth 2 (two) times daily as needed (anxiety/PTSD).   rosuvastatin 5 MG tablet Commonly known as: Crestor Take 1 tablet (5 mg total) by mouth daily.   rosuvastatin 5 MG tablet Commonly known as: CRESTOR TAKE 1 TABLET (5 MG TOTAL) BY MOUTH DAILY.        Signature:  Chesley Mires, MD [place] Pager - [phone] -  [phone] 03/03/2021, 10:34 AM

## 2021-03-03 NOTE — Patient Instructions (Signed)
Okay to proceed with surgery  Follow up as needed

## 2021-03-23 ENCOUNTER — Ambulatory Visit (HOSPITAL_BASED_OUTPATIENT_CLINIC_OR_DEPARTMENT_OTHER): Payer: No Typology Code available for payment source | Admitting: General Practice

## 2021-03-25 ENCOUNTER — Other Ambulatory Visit: Payer: Self-pay | Admitting: Neurological Surgery

## 2021-03-30 ENCOUNTER — Other Ambulatory Visit: Payer: Self-pay | Admitting: Neurological Surgery

## 2021-04-02 NOTE — Progress Notes (Signed)
He has beenCardiology Office Note   Date:  04/03/2021   ID:  Bryan Kent, DOB 1950/11/17, MRN [id number]  PCP:  [facility]  Cardiologist: Dr. Eleonore Chiquito CC: Pre-Operative Evaluation for Spine Surgery    History of Present Illness: Bryan Kent is a 70 y.o. male who presents for ongoing assessment and management of coronary artery disease.  Underwent left heart catheterization with PCI to the proximal and mid LAD in March 2022.   He is followed by cardiologist at the [facility].  Other history includes hyperlipidemia, COPD (followed by Dr. 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  He was to follow-up as needed as he is being followed at the [facility]. He is scheduled to have back surgery by Dr. Elwin Sleight in January 2023.  He has been seen by the cardiologist of [facility] who is also cleared him that he requires a local clearance.  Have any complaints of chest pain dyspnea on exertion fatigue or dizziness.  He denies any excessive bleeding but he does have a large bit of ecchymosis on his hands and forearms.   Past Medical History:  Diagnosis Date   Arthritis    Cancer ([place])    COPD (chronic obstructive pulmonary disease) ([place])    Coronary artery disease    Diabetes mellitus without complication ([place])    Hyperlipidemia    Hypertension    PTSD (post-traumatic stress disorder)    Rheumatoid arthritis ([place])     Past Surgical History:  Procedure Laterality Date   BACK SURGERY     fusion   CORONARY ATHERECTOMY N/A 06/19/2020   Procedure: CORONARY ATHERECTOMY;  Surgeon: Lorretta Harp, MD;  Location: [facility];  Service: Cardiovascular;  Laterality: N/A;   CORONARY PRESSURE WIRE/FFR WITH 3D MAPPING N/A 06/19/2020   Procedure: Coronary Pressure Wire/FFR w/3D Mapping;  Surgeon: Lorretta Harp, MD;   Location: [facility];  Service: Cardiovascular;  Laterality: N/A;   CORONARY STENT INTERVENTION  06/19/2020   CORONARY STENT INTERVENTION N/A 06/19/2020   Procedure: CORONARY STENT INTERVENTION;  Surgeon: Lorretta Harp, MD;  Location: [facility] [facility];  Service: Cardiovascular;  Laterality: N/A;   gastric ulcer     INTRAVASCULAR IMAGING/OCT N/A 06/19/2020   Procedure: INTRAVASCULAR IMAGING/OCT;  Surgeon: Lorretta Harp, MD;  Location: [place] [facility];  Service: Cardiovascular;  Laterality: N/A;   KNEE SURGERY     LEFT HEART CATH AND CORONARY ANGIOGRAPHY N/A 06/19/2020   Procedure: LEFT HEART CATH AND CORONARY ANGIOGRAPHY;  Surgeon: Lorretta Harp, MD;  Location: [place];  Service: Cardiovascular;  Laterality: N/A;   REPAIR OF PERFORATED ULCER     ROTATOR CUFF REPAIR       Current Outpatient Medications  Medication Sig Dispense Refill   albuterol (ACCUNEB) 0.63 MG/3ML nebulizer solution Inhale 0.63 mg into the lungs in the morning and at bedtime.     ALPRAZolam (XANAX) 0.5 MG tablet Take 0.5 mg by mouth 2 (two) times daily as needed for anxiety.     arformoterol (BROVANA) 15 MCG/2ML NEBU Take 15 mcg by nebulization 2 (two) times daily.     aspirin EC 81 MG tablet Take 81 mg by mouth daily. Swallow whole.     azithromycin (ZITHROMAX) 250 MG tablet Take 250 mg by mouth every Monday, Wednesday, and Friday.  budesonide-formoterol (SYMBICORT) 80-4.5 MCG/ACT inhaler Inhale 1 puff into the lungs in the morning and at bedtime.     clopidogrel (PLAVIX) 75 MG tablet Take 1 tablet (75 mg total) by mouth daily. 30 tablet 5   empagliflozin (JARDIANCE) 25 MG TABS tablet Take 25 mg by mouth daily.     folic acid (FOLVITE) 1 MG tablet Take 1 mg by mouth daily.      glipiZIDE (GLUCOTROL) 10 MG tablet Take 10 mg by mouth daily with supper.     guaifenesin (HUMIBID E) 400 MG TABS tablet Take 400 mg by mouth every 6 (six) hours as needed (cough/congestion).      hydroxychloroquine (PLAQUENIL) 200 MG tablet Take 200 mg by mouth daily.     Ipratropium-Albuterol (COMBIVENT) 20-100 MCG/ACT AERS respimat Inhale 1 puff into the lungs every 6 (six) hours as needed for wheezing or shortness of breath. 4 g 5   metFORMIN (GLUCOPHAGE) 1000 MG tablet Take 1,000 mg by mouth 2 (two) times daily with a meal.     methotrexate (RHEUMATREX) 2.5 MG tablet Take 20 mg by mouth every Saturday.     nitroGLYCERIN (NITROSTAT) 0.4 MG SL tablet PLACE 1 TABLET (0.4 MG TOTAL) UNDER THE TONGUE EVERY FIVE MINUTES AS NEEDED FOR CHEST PAIN. 25 tablet 0   pantoprazole (PROTONIX) 40 MG tablet TAKE 1 TABLET (40 MG TOTAL) BY MOUTH DAILY. 30 tablet 0   pioglitazone (ACTOS) 30 MG tablet Take 30 mg by mouth daily.      predniSONE (DELTASONE) 5 MG tablet Take 5 mg by mouth daily as needed (rheumatoid arthritis).     propranolol (INDERAL) 10 MG tablet Take 10 mg by mouth 2 (two) times daily as needed (anxiety/PTSD).     rosuvastatin (CRESTOR) 5 MG tablet Take 1 tablet (5 mg total) by mouth daily. 30 tablet 0   No current facility-administered medications for this visit.    Allergies:   Lobster [shellfish allergy], Benzalkonium chloride, Pravastatin, Simvastatin, and Neosporin [bacitracin-polymyxin b]    Social History:  The patient  reports that he has quit smoking. His smoking use included cigarettes. He has a 35.00 pack-year smoking history. He has never used smokeless tobacco. He reports that he does not currently use alcohol. He reports that he does not use drugs.   Family History:  The patient's family history includes Asthma in his mother; Heart disease in his father.    ROS: All other systems are reviewed and negative. Unless otherwise mentioned in H&P    PHYSICAL EXAM: VS:  BP 118/78    Pulse 86    Ht 5\' 8"  (1.727 m)    Wt 167 lb 6.4 oz (75.9 kg)    SpO2 94%    BMI 25.45 kg/m  , BMI Body mass index is 25.45 kg/m. GEN: Well nourished, well developed, in no acute distress HEENT:  normal Neck: no JVD, carotid bruits, or masses Cardiac: RRR; no murmurs, rubs, or gallops,no edema  Respiratory:  Clear to auscultation bilaterally, normal work of breathing GI: soft, nontender, nondistended, + BS MS: no deformity or atrophy Skin: warm and dry, no rash Neuro:  Strength and sensation are intact Psych: euthymic mood, full affect   EKG:  EKG is ordered today. The ekg ordered today demonstrates sinus rhythm with short PR.  Heart rate of 86 bpm.  (Personally reviewed)   Recent Labs: 06/20/2020: BUN 13; Creatinine, Ser 0.77; Hemoglobin 12.9; Platelets 276; Potassium 4.1; Sodium 136    Lipid Panel No results found for: CHOL,  TRIG, HDL, CHOLHDL, VLDL, LDLCALC, LDLDIRECT    Wt Readings from Last 3 Encounters:  04/03/21 167 lb 6.4 oz (75.9 kg)  03/03/21 170 lb 0.6 oz (77.1 kg)  07/03/20 179 lb (81.2 kg)      Other studies Reviewed: LHC 06/19/2020   Prox RCA to Mid RCA lesion is 40% stenosed. Prox LAD lesion is 70% stenosed. Mid LAD lesion is 95% stenosed. A drug-eluting stent was successfully placed using a SYNERGY XD 2.75X28. Post intervention, there is a 0% residual stenosis. A drug-eluting stent was successfully placed using a SYNERGY XD 3.50X16. Post intervention, there is a 0% residual stenosis. The left ventricular systolic function is normal. LV end diastolic pressure is normal. The left ventricular ejection fraction is 50-55% by visual estimate.   ASSESSMENT AND PLAN:  1.  Preoperative cardiac evaluation: He is due to have L5-S1 repair.  Chart reviewed as part of pre-operative protocol coverage. Given past medical history and time since last visit, based on ACC/AHA guidelines, SHARON STAPEL would be at acceptable risk for the planned procedure without further cardiovascular testing.  We will need to hold Plavix 5 days prior to his surgery.  Start again as soon as possible thereafter.  We will talk about stopping Plavix and March 2023 follow-up  2.  CAD:  History of PCI to proximal and mid LAD in March 2022.  He is followed by a by Dr. Davina Poke and [facility] cardiologist.  Remains on dual antiplatelet therapy.  Continue secondary management with dual antiplatelet therapy, and statin with goal of LDL less than 70 continue guideline recommended for for multiple cardiovascular risk factors.  We do include diabetes.  Labs are followed by [facility].   3.  [facility]  4.  Type 2 diabetes: Followed by [facility]  Current medicines are reviewed at length with the patient today.  I have spent 25 min's  dedicated to the care of this patient on the date of this encounter to include pre-visit review of records, assessment, management and diagnostic testing,with shared decision making.  Labs/ tests ordered today include: None   Phill Myron. West Pugh, ANP, AACC   04/03/2021 4:19 PM    [address] Office [phone] Fax [phone]  Notice: This dictation was prepared with Dragon dictation along with smaller phrase technology. Any transcriptional errors that result from this process are unintentional and may not be corrected upon review.

## 2021-04-03 ENCOUNTER — Ambulatory Visit: Payer: Non-veteran care | Admitting: Adult Health

## 2021-04-03 ENCOUNTER — Encounter: Payer: Self-pay | Admitting: Adult Health

## 2021-04-03 ENCOUNTER — Ambulatory Visit (INDEPENDENT_AMBULATORY_CARE_PROVIDER_SITE_OTHER): Payer: No Typology Code available for payment source | Admitting: Adult Health

## 2021-04-03 ENCOUNTER — Other Ambulatory Visit: Payer: Self-pay

## 2021-04-03 VITALS — BP 118/78 | HR 86 | Ht 68.0 in | Wt 167.4 lb

## 2021-04-03 DIAGNOSIS — Z01818 Encounter for other preprocedural examination: Secondary | ICD-10-CM | POA: Diagnosis not present

## 2021-04-03 DIAGNOSIS — I2510 Atherosclerotic heart disease of native coronary artery without angina pectoris: Secondary | ICD-10-CM

## 2021-04-03 DIAGNOSIS — E7800 Pure hypercholesterolemia, unspecified: Secondary | ICD-10-CM | POA: Diagnosis not present

## 2021-04-03 DIAGNOSIS — Z955 Presence of coronary angioplasty implant and graft: Secondary | ICD-10-CM | POA: Diagnosis not present

## 2021-04-03 DIAGNOSIS — J449 Chronic obstructive pulmonary disease, unspecified: Secondary | ICD-10-CM

## 2021-04-03 NOTE — Patient Instructions (Signed)
Medication Instructions:  No Changes *If you need a refill on your cardiac medications before your next appointment, please call your pharmacy*   Lab Work: No Labs If you have labs (blood work) drawn today and your tests are completely normal, you will receive your results only by: Max (if you have MyChart) OR A paper copy in the mail If you have any lab test that is abnormal or we need to change your treatment, we will call you to review the results.   Testing/Procedures: No Testing   Follow-Up: At [facility], you and your health needs are our priority.  As part of our continuing mission to provide you with exceptional heart care, we have created designated Provider Care Teams.  These Care Teams include your primary Cardiologist (physician) and Advanced Practice Providers (APPs -  Physician Assistants and Nurse Practitioners) who all work together to provide you with the care you need, when you need it.  We recommend signing up for the patient portal called "MyChart".  Sign up information is provided on this After Visit Summary.  MyChart is used to connect with patients for Virtual Visits (Telemedicine).  Patients are able to view lab/test results, encounter notes, upcoming appointments, etc.  Non-urgent messages can be sent to your provider as well.   To learn more about what you can do with MyChart, go to [email or web address].    Your next appointment:   3 month(s)  The format for your next appointment:   In Person  Provider:   Evalina Field, MD     Other Instructions Hold Plavix 5 Days prior to Procedure .

## 2021-04-16 ENCOUNTER — Institutional Professional Consult (permissible substitution): Payer: No Typology Code available for payment source | Admitting: Internal Medicine

## 2021-04-16 NOTE — Progress Notes (Signed)
Surgical Instructions   Your procedure is scheduled on Tuesday 04/21/2021.  Report to [facility] Main Entrance "A" at 11:30 A.M., then check in with the Admitting office.  Call [phone] if you have problems or questions between now and the morning of surgery:   Remember: Do not eat or drink after midnight the night before your surgery    Take these medicines the morning of surgery with A SIP OF WATER:  Albuterol (ACCUMEB) nebulizer treatment Arformoterol (Brovana) nebulizer treatment Budesonide-formoterol (Symbicort) inhaler - Please bring all inhalers with you the day of surgery.  Hydroxychloroquine (Plaquenil) Pantoprazole (Protonix) Rosuvastatin (Crestor)  If needed you may take these medications the morning of surgery: Ipratropium-Albuterol (Combivent) inhaler - Please bring all inhalers with you the day of surgery.  Nitroglycerin (Nitrostat) Propanolol (Inderal)  As of today, STOP taking any Aspirin (unless otherwise instructed by your surgeon) or Aspirin-containing products; NSAIDS - Aleve, Naproxen, Ibuprofen, Motrin, Advil, Goody's, BC's, all herbal medications, fish oil, and all vitamins.  Follow your surgeon's instructions on when to stop Aspirin.  If no instructions were given by your surgeon then you will need to call the office to get those instructions.    HOLD your Clopidogrel (Plavix) for 5 days prior to surgery.  WHAT DO I DO ABOUT MY DIABETES MEDICATION?  Empagliflozin (Jardiance) - HOLD the day before surgery (04/20/2021) and the morning of surgery (04/21/2021).  Glipizide (Glucotrol) - The day before surgery (04/20/2021), take only morning and/or lunch doses.  DO NOT take evening dose the day before surgery.  HOLD the morning of surgery (04/21/2021).  Metformin (Gluphage) - The day before surgery (04/20/2021), take usual doses.  HOLD the morning of surgery (04/21/2021).  Pioglitazone (Actos) - The day before surgery (04/20/2021), take usual doses.  HOLD the morning of  surgery (04/21/2021).   HOW TO MANAGE YOUR DIABETES BEFORE AND AFTER SURGERY  Why is it important to control my blood sugar before and after surgery? Improving blood sugar levels before and after surgery helps healing and can limit problems. A way of improving blood sugar control is eating a healthy diet by:  Eating less sugar and carbohydrates  Increasing activity/exercise  Talking with your doctor about reaching your blood sugar goals High blood sugars (greater than 180 mg/dL) can raise your risk of infections and slow your recovery, so you will need to focus on controlling your diabetes during the weeks before surgery. Make sure that the doctor who takes care of your diabetes knows about your planned surgery including the date and location.  How do I manage my blood sugar before surgery? Check your blood sugar at least 4 times a day, starting 2 days before surgery, to make sure that the level is not too high or low.  Check your blood sugar the morning of your surgery when you wake up and every 2 hours until you get to the Short Stay unit.  If your blood sugar is less than 70 mg/dL, you will need to treat for low blood sugar: Do not take insulin. Treat a low blood sugar (less than 70 mg/dL) with  cup of clear juice (cranberry or apple), 4 glucose tablets, OR glucose gel. Recheck blood sugar in 15 minutes after treatment (to make sure it is greater than 70 mg/dL). If your blood sugar is not greater than 70 mg/dL on recheck, call [phone] for further instructions. Report your blood sugar to the short stay nurse when you get to Short Stay.  If you are admitted to  the hospital after surgery: Your blood sugar will be checked by the staff and you will probably be given insulin after surgery (instead of oral diabetes medicines) to make sure you have good blood sugar levels./ The goal for blood sugar control after surgery is 80-180 mg/dL.    3 days leading up to your surgery OR after  your pre-procedure COVID test You are not required to quarantine however you are required to wear a well-fitting mask when you are out and around people not in your household.  If your mask becomes wet or soiled, replace with a new one.  Wash your hands often with soap and water for 20 seconds or clean your hands with an alcohol-based hand sanitizer that contains at least 60% alcohol.  Do not share personal items.  Notify your provider: if you are in close contact with someone who has COVID  or if you develop a fever of 100.4 or greater, sneezing, cough, sore throat, shortness of breath or body aches.          Do not wear jewelry   Do not wear lotions, powders, colognes, or deodorant.  Do not shave 48 hours prior to surgery.  Men may shave face and neck.  Do not bring valuables to the hospital - [facility] is not responsible for any belongings or valuables.  Do NOT Smoke (Tobacco/Vaping) or drink Alcohol 24 hours prior to your procedure  If you use a CPAP at night, please bring your mask for your overnight stay.   Contacts, glasses, hearing aids, dentures or partials may not be worn into surgery, please bring cases for these belongings   For patients admitted to the hospital, discharge time will be determined by your treatment team.   Patients discharged the day of surgery will not be allowed to drive home, and someone needs to stay with them for 24 hours.  NO VISITORS WILL BE ALLOWED IN PRE-OP WHERE PATIENTS ARE PREPPED FOR SURGERY.  ONLY 1 SUPPORT PERSON MAY BE PRESENT IN THE WAITING ROOM WHILE YOU ARE IN SURGERY.  IF YOU ARE TO BE ADMITTED, ONCE YOU ARE IN YOUR ROOM YOU WILL BE ALLOWED TWO (2) VISITORS. 1 (ONE) VISITOR MAY STAY OVERNIGHT BUT MUST ARRIVE TO THE ROOM BY 8pm.  Minor children may have two parents present. Special consideration for safety and communication needs will be reviewed on a case by case basis.  Special instructions:    Oral Hygiene is also important to  reduce your risk of infection.  Remember - BRUSH YOUR TEETH THE MORNING OF SURGERY WITH YOUR REGULAR TOOTHPASTE   [place] [facility]- Preparing For Surgery  Before surgery, you can play an important role. Because skin is not sterile, your skin needs to be as free of germs as possible. You can reduce the number of germs on your skin by washing with CHG (chlorahexidine gluconate) Soap before surgery.  CHG is an antiseptic cleaner which kills germs and bonds with the skin to continue killing germs even after washing.     Please do not use if you have an allergy to CHG or antibacterial soaps. If your skin becomes reddened/irritated stop using the CHG.  Do not shave (including legs and underarms) for at least 48 hours prior to first CHG shower. It is OK to shave your face.  Please follow these instructions carefully.     Shower the NIGHT BEFORE SURGERY and the MORNING OF SURGERY with CHG Soap.   If you chose to wash your hair,  wash your hair first as usual with your normal shampoo. After you shampoo, rinse your hair and body thoroughly to remove the shampoo.    Then [employer] and genitals (private parts) with your normal soap and rinse thoroughly to remove soap.  Next use the CHG Soap as you would any other liquid soap. You can apply CHG directly to the skin and wash gently with a clean washcloth.   Apply the CHG Soap to your body ONLY FROM THE NECK DOWN.  Do not use on open wounds or open sores. Avoid contact with your eyes, ears, mouth and genitals (private parts). Wash Face and genitals (private parts)  with your normal soap.   Wash thoroughly, paying special attention to the area where your surgery will be performed.  Thoroughly rinse your body with warm water from the neck down.  DO NOT shower/wash with your normal soap after using and rinsing off the CHG Soap.  Pat yourself dry with a CLEAN TOWEL.  Wear CLEAN PAJAMAS to bed the night before surgery  Place CLEAN SHEETS on your bed the  night before your surgery  DO NOT SLEEP WITH PETS.   Day of Surgery:  Take a shower with CHG soap. Wear Clean/Comfortable clothing the morning of surgery Do not apply any deodorants/lotions.   Remember to brush your teeth WITH YOUR REGULAR TOOTHPASTE.   Please read over the fact sheets that you were given.

## 2021-04-17 ENCOUNTER — Other Ambulatory Visit: Payer: Self-pay

## 2021-04-17 ENCOUNTER — Encounter (HOSPITAL_COMMUNITY)
Admission: RE | Admit: 2021-04-17 | Discharge: 2021-04-17 | Disposition: A | Discharge status: Home / Self Care | Payer: No Typology Code available for payment source | Source: Ambulatory Visit | Attending: Neurological Surgery | Admitting: Neurological Surgery

## 2021-04-17 ENCOUNTER — Encounter (HOSPITAL_COMMUNITY): Payer: Self-pay

## 2021-04-17 VITALS — BP 140/79 | HR 93 | Temp 98.2°F | Resp 17 | Ht 68.0 in | Wt 168.0 lb

## 2021-04-17 DIAGNOSIS — I2510 Atherosclerotic heart disease of native coronary artery without angina pectoris: Secondary | ICD-10-CM | POA: Diagnosis not present

## 2021-04-17 DIAGNOSIS — Z20822 Contact with and (suspected) exposure to covid-19: Secondary | ICD-10-CM | POA: Insufficient documentation

## 2021-04-17 DIAGNOSIS — Z7902 Long term (current) use of antithrombotics/antiplatelets: Secondary | ICD-10-CM | POA: Insufficient documentation

## 2021-04-17 DIAGNOSIS — Z01818 Encounter for other preprocedural examination: Secondary | ICD-10-CM

## 2021-04-17 DIAGNOSIS — Z87891 Personal history of nicotine dependence: Secondary | ICD-10-CM | POA: Diagnosis not present

## 2021-04-17 DIAGNOSIS — Z01812 Encounter for preprocedural laboratory examination: Secondary | ICD-10-CM | POA: Insufficient documentation

## 2021-04-17 DIAGNOSIS — J449 Chronic obstructive pulmonary disease, unspecified: Secondary | ICD-10-CM | POA: Insufficient documentation

## 2021-04-17 DIAGNOSIS — Z79899 Other long term (current) drug therapy: Secondary | ICD-10-CM | POA: Insufficient documentation

## 2021-04-17 DIAGNOSIS — E118 Type 2 diabetes mellitus with unspecified complications: Secondary | ICD-10-CM | POA: Insufficient documentation

## 2021-04-17 DIAGNOSIS — M069 Rheumatoid arthritis, unspecified: Secondary | ICD-10-CM | POA: Insufficient documentation

## 2021-04-17 HISTORY — DX: Failed or difficult intubation, initial encounter: T88.4XXA

## 2021-04-17 LAB — CBC
HCT: 43.5 % (ref 39.0–52.0)
Hemoglobin: 14.5 g/dL (ref 13.0–17.0)
MCH: 34.6 pg — ABNORMAL HIGH (ref 26.0–34.0)
MCHC: 33.3 g/dL (ref 30.0–36.0)
MCV: 103.8 fL — ABNORMAL HIGH (ref 80.0–100.0)
Platelets: 259 10*3/uL (ref 150–400)
RBC: 4.19 MIL/uL — ABNORMAL LOW (ref 4.22–5.81)
RDW: 13.4 % (ref 11.5–15.5)
WBC: 14.4 10*3/uL — ABNORMAL HIGH (ref 4.0–10.5)
nRBC: 0 % (ref 0.0–0.2)

## 2021-04-17 LAB — TYPE AND SCREEN
ABO/RH(D): A NEG
Antibody Screen: NEGATIVE

## 2021-04-17 LAB — SURGICAL PCR SCREEN
MRSA, PCR: NEGATIVE
Staphylococcus aureus: NEGATIVE

## 2021-04-17 LAB — BASIC METABOLIC PANEL
Anion gap: 11 (ref 5–15)
BUN: 16 mg/dL (ref 8–23)
CO2: 25 mmol/L (ref 22–32)
Calcium: 9.1 mg/dL (ref 8.9–10.3)
Chloride: 100 mmol/L (ref 98–111)
Creatinine, Ser: 0.85 mg/dL (ref 0.61–1.24)
GFR, Estimated: >60 mL/min (ref >60)
Glucose, Bld: 237 mg/dL — ABNORMAL HIGH (ref 70–99)
Potassium: 4.6 mmol/L (ref 3.5–5.1)
Sodium: 136 mmol/L (ref 135–145)

## 2021-04-17 LAB — GLUCOSE, CAPILLARY: Glucose-Capillary: 257 mg/dL — ABNORMAL HIGH (ref 70–99)

## 2021-04-17 LAB — SARS CORONAVIRUS 2 (TAT 6-24 HRS): SARS Coronavirus 2: NEGATIVE

## 2021-04-17 NOTE — Progress Notes (Signed)
PCP - [facility] Cardiologist - Dr. Lake Bells O'Neal Pulmonologist - Dr. Chesley Mires, MD  PPM/ICD - n/a Device Orders -  Rep Notified -   Chest x-ray - 09/12/20 EKG - 04/03/21 Stress Test -  ECHO - 07/06/17 Cardiac Cath - 06/18/20  Sleep Study - patient denies CPAP -   Fasting Blood Sugar - 130-150 Checks Blood Sugar 1  time a day A1C - 9.5 on 02/18/2021 CBG 257 at PAT - patient had coffee with french creamer and sugar before coming  Blood Thinner Instructions: Plavix 04/03/21 Aspirin Instructions: ASA 04/04/21  ERAS Protcol - NPO p MN PRE-SURGERY Ensure or G2-   COVID TEST- at PAT appointment   Anesthesia review: yes, Jeneen Rinks made aware.  Patient denies shortness of breath, fever, cough and chest pain at PAT appointment.   All instructions explained to the patient, with a verbal understanding of the material. Patient agrees to go over the instructions while at home for a better understanding. Patient also instructed to mask when out in public after being tested for COVID-19. The opportunity to ask questions was provided.

## 2021-04-17 NOTE — Progress Notes (Addendum)
Anesthesia Chart Review: Same day workup  Follows with cardiology for history of CAD s/p PCI to the proximal and mid LAD March 2022.  He is primarily followed by cardiology at the [facility], however he was seen by Bunnie Domino, NP on 04/03/2021 for preop evaluation.  Per note, " Preoperative cardiac evaluation: He is due to have L5-S1 repair. Chart reviewed as part of pre-operative protocol coverage. Given past medical history and time since last visit, based on ACC/AHA guidelines, TEEGAN BRANDIS would be at acceptable risk for the planned procedure without further cardiovascular testing.  We will need to hold Plavix 5 days prior to his surgery.  Start again as soon as possible thereafter.  We will talk about stopping Plavix and March 2023 follow-up."  Former smoker (quit 2016) with history of COPD.  He is maintained on Brovana daily and Combivent as needed.  He sporadically uses Symbicort.  Seen by pulmonologist Dr. Halford Chessman 03/03/2021 for preop evaluation.  Per note, "COPD from emphysema. - will maintained on his current regimen - no additional testing needed at this time - he plans to resume follow up at the [facility] Back pain. - he is followed by Dr. Pieter Partridge Dawley with neurosurgery - no pulmonary contraindications for him to proceed with surgery."  History of rheumatoid arthritis maintained on methotrexate and Plaquenil.  Intermittently use prednisone for flares.  DM2, uncontrolled, not on insulin.  This is followed at the [place].  Last A1c 9.5 on 03/02/2021.  Review of anesthesia notes from [place] 12/02/16 indicates difficult intubation, grade 3 view, glidescope used. Per intubation note, "An Intubation: Smooth IV induction after adequate preoxygenation. 2 hand mask ventilation. Eyes taped prior to airway manipulation. DL x1 with Mac 4 - grade III view. Glidescope #3 brought to room. Clear view of glottic opening, ETT through glottic opening atraumatically. Secured at 23 cm after bilateral breath sounds and +ETCO2  confirmed. Dentition & lips remain intact as pre-op assessment."  Patient will need day of surgery labs and evaluation.  EKG 04/03/2021: Sinus rhythm with short PR (PR interval 94 ms).  Rate 86.  TTE 12/10/20 (care everywhere): Interpretation Summary Left ventricular systolic function is normal. EF= 60% by 2D biplane method. No regional wall motion abnormalities noted. Grade I diastolic dysfunction, (abnormal relaxation pattern). No hemodynamically significant valvular aortic stenosis. There is trace mitral regurgitation. There is mild tricuspid regurgitation. Right ventricular systolic pressure is estimated at 30 mmHg.  Cath and PCI 06/19/2020: Prox RCA to Mid RCA lesion is 40% stenosed. Prox LAD lesion is 70% stenosed. Mid LAD lesion is 95% stenosed. A drug-eluting stent was successfully placed using a SYNERGY XD 2.75X28. Post intervention, there is a 0% residual stenosis. A drug-eluting stent was successfully placed using a SYNERGY XD 3.50X16. Post intervention, there is a 0% residual stenosis. The left ventricular systolic function is normal. LV end diastolic pressure is normal. The left ventricular ejection fraction is 50-55% by visual estimate.    Wynonia Musty [facility] Short Stay Center/Anesthesiology Phone [phone] 04/17/2021 10:29 AM

## 2021-04-17 NOTE — Anesthesia Preprocedure Evaluation (Addendum)
Anesthesia Evaluation  Patient identified by MRN, date of birth, ID band Patient awake    Reviewed: Patient's Chart, lab work & pertinent test results  Airway Mallampati: II  TM Distance: >3 FB     Dental   Pulmonary COPD, former smoker,    breath sounds clear to auscultation       Cardiovascular hypertension, + CAD   Rhythm:Regular Rate:Normal     Neuro/Psych PSYCHIATRIC DISORDERS    GI/Hepatic negative GI ROS, Neg liver ROS,   Endo/Other  diabetes  Renal/GU negative Renal ROS     Musculoskeletal  (+) Arthritis ,   Abdominal   Peds  Hematology   Anesthesia Other Findings   Reproductive/Obstetrics                            Anesthesia Physical Anesthesia Plan  ASA: 3  Anesthesia Plan: General   Post-op Pain Management:    Induction: Intravenous  PONV Risk Score and Plan: 2  Airway Management Planned: Oral ETT  Additional Equipment:   Intra-op Plan:   Post-operative Plan: Possible Post-op intubation/ventilation  Informed Consent: I have reviewed the patients History and Physical, chart, labs and discussed the procedure including the risks, benefits and alternatives for the proposed anesthesia with the patient or authorized representative who has indicated his/her understanding and acceptance.     Dental advisory given  Plan Discussed with: CRNA and Anesthesiologist  Anesthesia Plan Comments: (PAT note by Karoline Caldwell, PA-C: Follows with cardiology for history of CAD s/p PCI to the proximal and mid LAD March 2022.  He is primarily followed by cardiology at the [facility], however he was seen by Bunnie Domino, NP on 04/03/2021 for preop evaluation.  Per note, "Preoperative cardiac evaluation:He is due to have L5-S1 repair. Chart reviewed as part of pre-operative protocol coverage. Given past medical history and time since last visit, based on ACC/AHA guidelines,Bryan B  Riverswould be at acceptable risk for the planned procedure without further cardiovascular testing. We will need to hold Plavix 5 days prior to his surgery. Start again as soon as possible thereafter. We will talk about stopping Plavix and March 2023 follow-up."  Former smoker (quit 2016) with history of COPD.  He is maintained on Brovana daily and Combivent as needed.  He sporadically uses Symbicort.  Seen by pulmonologist Dr. Halford Chessman 03/03/2021 for preop evaluation.  Per note, "COPD from emphysema. - will maintained on his current regimen - no additional testing needed at this time - he plans to resume follow up at the [facility] Back pain. - he is followed by Dr. Pieter Partridge Dawley with neurosurgery - no pulmonary contraindications for him to proceed with surgery."  History of rheumatoid arthritis maintained on methotrexate and Plaquenil.  Intermittently use prednisone for flares.  DM2, uncontrolled, not on insulin.  This is followed at the [place].  Last A1c 9.5 on 03/02/2021.  Review of anesthesia notes from [place] 12/02/16 indicates difficult intubation, grade 3 view, glidescope used. Per intubation note, "An Intubation: Smooth IV induction after adequate preoxygenation. 2 hand mask ventilation. Eyes taped prior to airway manipulation. DL x1 with Mac 4 - grade III view. Glidescope #3 brought to room. Clear view of glottic opening, ETT through glottic opening atraumatically. Secured at 23 cm after bilateral breath sounds and +ETCO2 confirmed. Dentition & lips remain intact as pre-op assessment."  Patient will need day of surgery labs and evaluation.  EKG 04/03/2021: Sinus rhythm with short PR (PR interval 94  ms).  Rate 86.  TTE 12/10/20 (care everywhere): Interpretation Summary Left ventricular systolic function is normal. EF= 60% by 2D biplane method. No regional wall motion abnormalities noted. Grade I diastolic dysfunction, (abnormal relaxation pattern). No hemodynamically significant valvular aortic  stenosis. There is trace mitral regurgitation. There is mild tricuspid regurgitation. Right ventricular systolic pressure is estimated at 30 mmHg.  Cath and PCI 06/19/2020:  Prox RCA to Mid RCA lesion is 40% stenosed.  Prox LAD lesion is 70% stenosed.  Mid LAD lesion is 95% stenosed.  A drug-eluting stent was successfully placed using a SYNERGY XD 2.75X28.  Post intervention, there is a 0% residual stenosis.  A drug-eluting stent was successfully placed using a SYNERGY XD 3.50X16.  Post intervention, there is a 0% residual stenosis.  The left ventricular systolic function is normal.  LV end diastolic pressure is normal.  The left ventricular ejection fraction is 50-55% by visual estimate.  )       Anesthesia Quick Evaluation

## 2021-04-21 ENCOUNTER — Encounter (HOSPITAL_COMMUNITY)
Admission: RE | Disposition: A | Discharge status: Home / Self Care | Payer: Self-pay | Source: Home / Self Care | Attending: Neurological Surgery

## 2021-04-21 ENCOUNTER — Inpatient Hospital Stay (HOSPITAL_COMMUNITY): Payer: No Typology Code available for payment source | Admitting: Physician Assistant

## 2021-04-21 ENCOUNTER — Other Ambulatory Visit: Payer: Self-pay

## 2021-04-21 ENCOUNTER — Inpatient Hospital Stay (HOSPITAL_COMMUNITY): Payer: No Typology Code available for payment source

## 2021-04-21 ENCOUNTER — Encounter (HOSPITAL_COMMUNITY): Payer: Self-pay | Admitting: Neurological Surgery

## 2021-04-21 ENCOUNTER — Inpatient Hospital Stay (HOSPITAL_COMMUNITY)
Admission: RE | Admit: 2021-04-21 | Discharge: 2021-04-23 | DRG: 455 | Disposition: A | Discharge status: Home / Self Care | Payer: No Typology Code available for payment source | Attending: Neurological Surgery | Admitting: Neurological Surgery

## 2021-04-21 DIAGNOSIS — Z8249 Family history of ischemic heart disease and other diseases of the circulatory system: Secondary | ICD-10-CM

## 2021-04-21 DIAGNOSIS — Z7984 Long term (current) use of oral hypoglycemic drugs: Secondary | ICD-10-CM | POA: Diagnosis not present

## 2021-04-21 DIAGNOSIS — Z7951 Long term (current) use of inhaled steroids: Secondary | ICD-10-CM | POA: Diagnosis not present

## 2021-04-21 DIAGNOSIS — E119 Type 2 diabetes mellitus without complications: Secondary | ICD-10-CM | POA: Diagnosis not present

## 2021-04-21 DIAGNOSIS — Z981 Arthrodesis status: Secondary | ICD-10-CM

## 2021-04-21 DIAGNOSIS — M48062 Spinal stenosis, lumbar region with neurogenic claudication: Principal | ICD-10-CM

## 2021-04-21 DIAGNOSIS — Z825 Family history of asthma and other chronic lower respiratory diseases: Secondary | ICD-10-CM

## 2021-04-21 DIAGNOSIS — J449 Chronic obstructive pulmonary disease, unspecified: Secondary | ICD-10-CM | POA: Diagnosis present

## 2021-04-21 DIAGNOSIS — I2510 Atherosclerotic heart disease of native coronary artery without angina pectoris: Secondary | ICD-10-CM | POA: Diagnosis present

## 2021-04-21 DIAGNOSIS — Z9109 Other allergy status, other than to drugs and biological substances: Secondary | ICD-10-CM | POA: Diagnosis not present

## 2021-04-21 DIAGNOSIS — G8929 Other chronic pain: Secondary | ICD-10-CM | POA: Diagnosis present

## 2021-04-21 DIAGNOSIS — M5116 Intervertebral disc disorders with radiculopathy, lumbar region: Secondary | ICD-10-CM | POA: Diagnosis not present

## 2021-04-21 DIAGNOSIS — Z87891 Personal history of nicotine dependence: Secondary | ICD-10-CM | POA: Diagnosis not present

## 2021-04-21 DIAGNOSIS — Z7902 Long term (current) use of antithrombotics/antiplatelets: Secondary | ICD-10-CM | POA: Diagnosis not present

## 2021-04-21 DIAGNOSIS — E785 Hyperlipidemia, unspecified: Secondary | ICD-10-CM | POA: Diagnosis present

## 2021-04-21 DIAGNOSIS — Z888 Allergy status to other drugs, medicaments and biological substances status: Secondary | ICD-10-CM | POA: Diagnosis not present

## 2021-04-21 DIAGNOSIS — M1990 Unspecified osteoarthritis, unspecified site: Secondary | ICD-10-CM | POA: Diagnosis present

## 2021-04-21 DIAGNOSIS — Z792 Long term (current) use of antibiotics: Secondary | ICD-10-CM

## 2021-04-21 DIAGNOSIS — Z79899 Other long term (current) drug therapy: Secondary | ICD-10-CM

## 2021-04-21 DIAGNOSIS — F4310 Post-traumatic stress disorder, unspecified: Secondary | ICD-10-CM | POA: Diagnosis not present

## 2021-04-21 DIAGNOSIS — Z91013 Allergy to seafood: Secondary | ICD-10-CM

## 2021-04-21 DIAGNOSIS — M069 Rheumatoid arthritis, unspecified: Secondary | ICD-10-CM | POA: Diagnosis not present

## 2021-04-21 DIAGNOSIS — I10 Essential (primary) hypertension: Secondary | ICD-10-CM | POA: Diagnosis not present

## 2021-04-21 DIAGNOSIS — Z419 Encounter for procedure for purposes other than remedying health state, unspecified: Secondary | ICD-10-CM

## 2021-04-21 HISTORY — PX: TRANSFORAMINAL LUMBAR INTERBODY FUSION W/ MIS 1 LEVEL: SHX6145

## 2021-04-21 LAB — GLUCOSE, CAPILLARY
Glucose-Capillary: 140 mg/dL — ABNORMAL HIGH (ref 70–99)
Glucose-Capillary: 119 mg/dL — ABNORMAL HIGH (ref 70–99)
Glucose-Capillary: 154 mg/dL — ABNORMAL HIGH (ref 70–99)
Glucose-Capillary: 220 mg/dL — ABNORMAL HIGH (ref 70–99)
Glucose-Capillary: 202 mg/dL — ABNORMAL HIGH (ref 70–99)

## 2021-04-21 LAB — ABO/RH: ABO/RH(D): A NEG

## 2021-04-21 IMAGING — RF DG LUMBAR SPINE 2-3V
1 series · 4 of 4 positions shown · non-contrast
Comparison: [DATE] radiographs

CLINICAL DATA: Intraoperative, left L3-L4 fusion and revision of
previous hardware

EXAM:
LUMBAR SPINE - 2-3 VIEW

[Series 1: run · 4 of 4 slices shown]
[im 1/4]
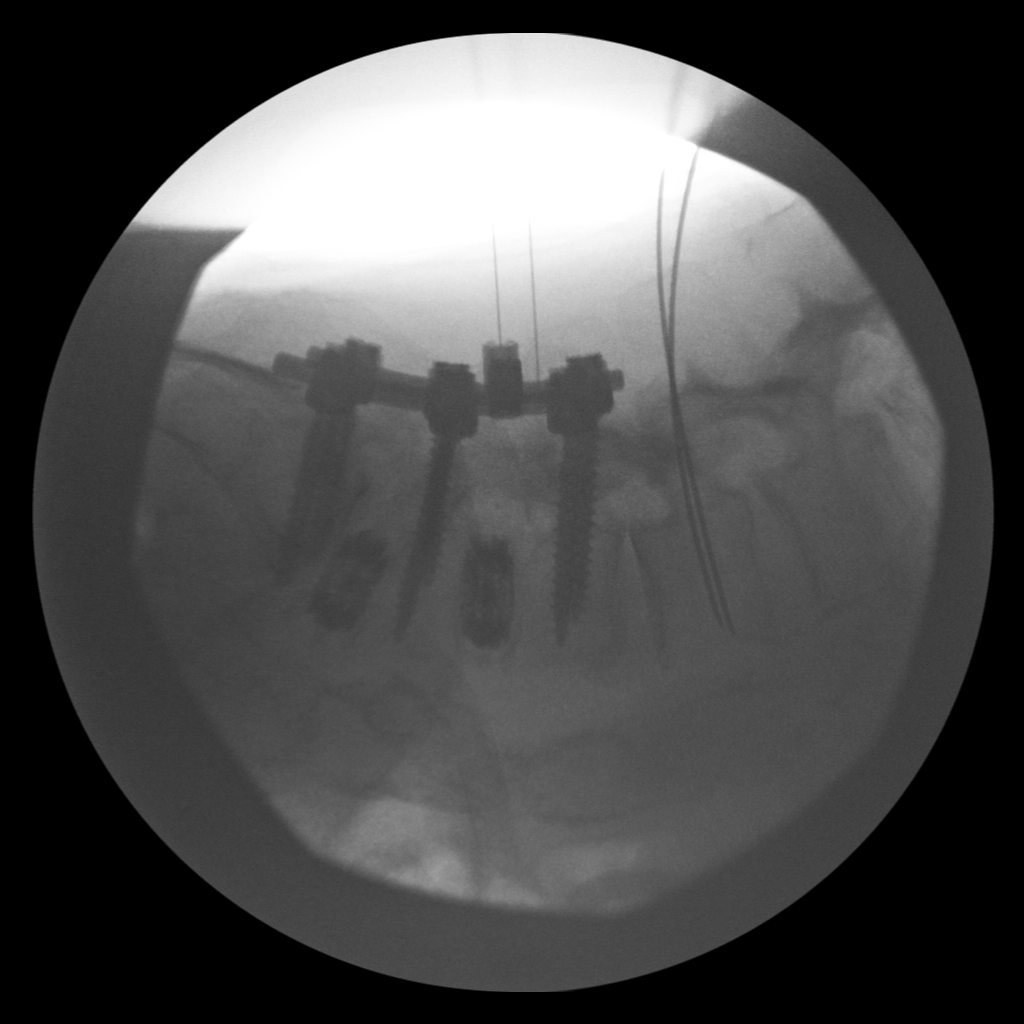
[im 2/4]
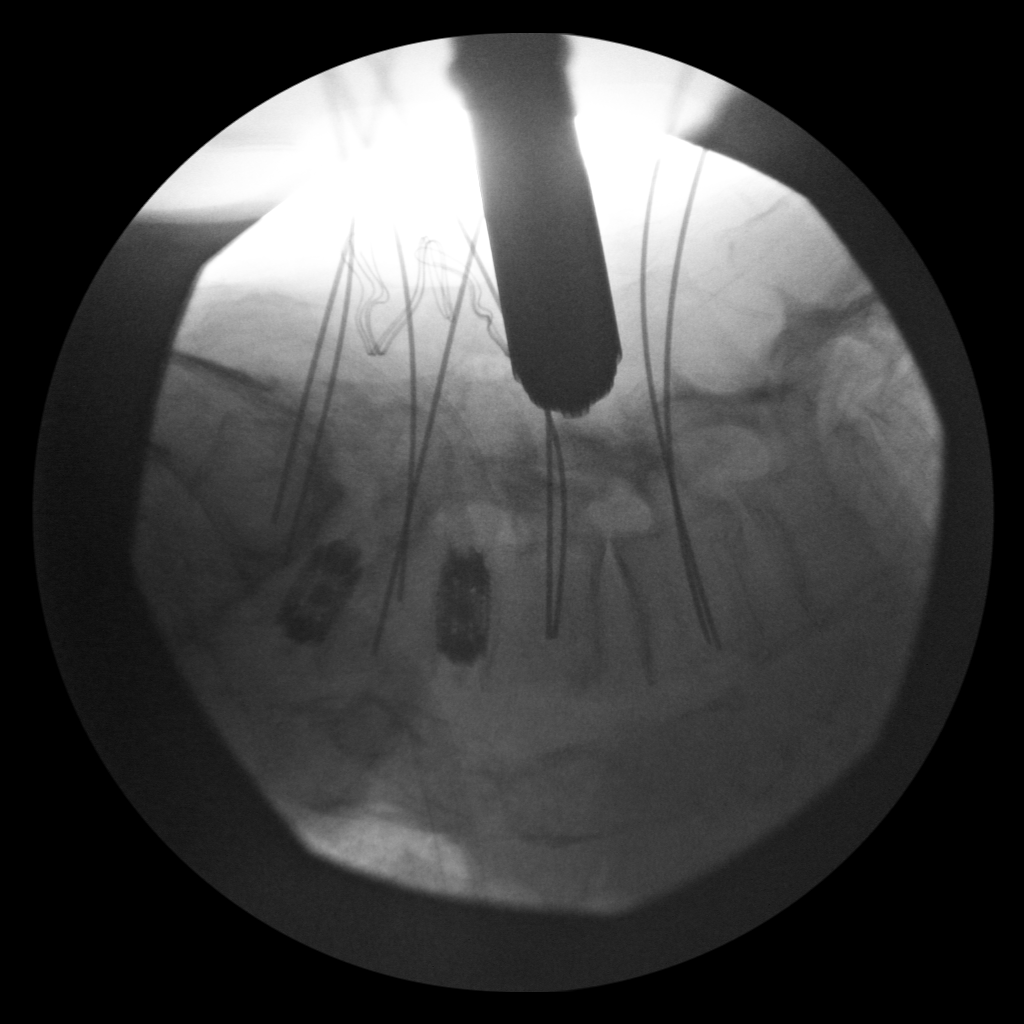
[im 3/4]
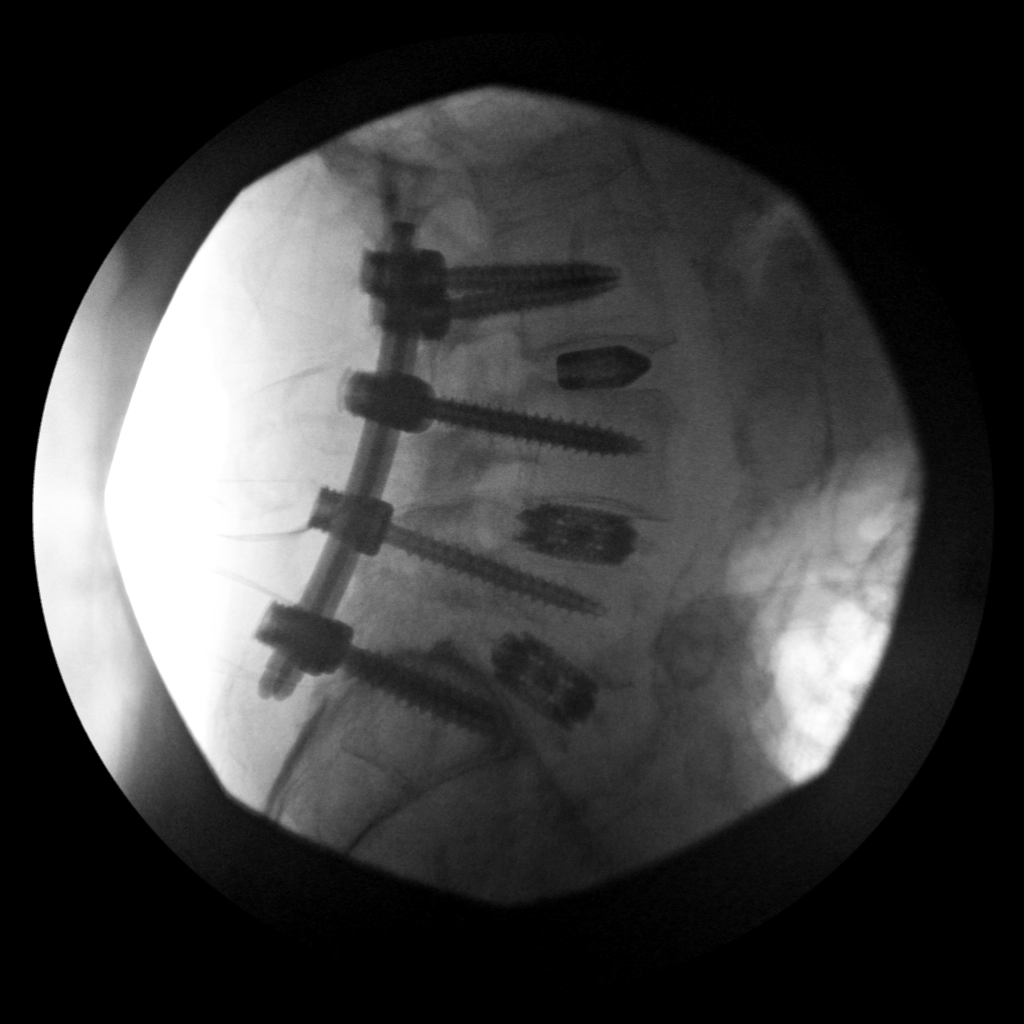
[im 4/4]
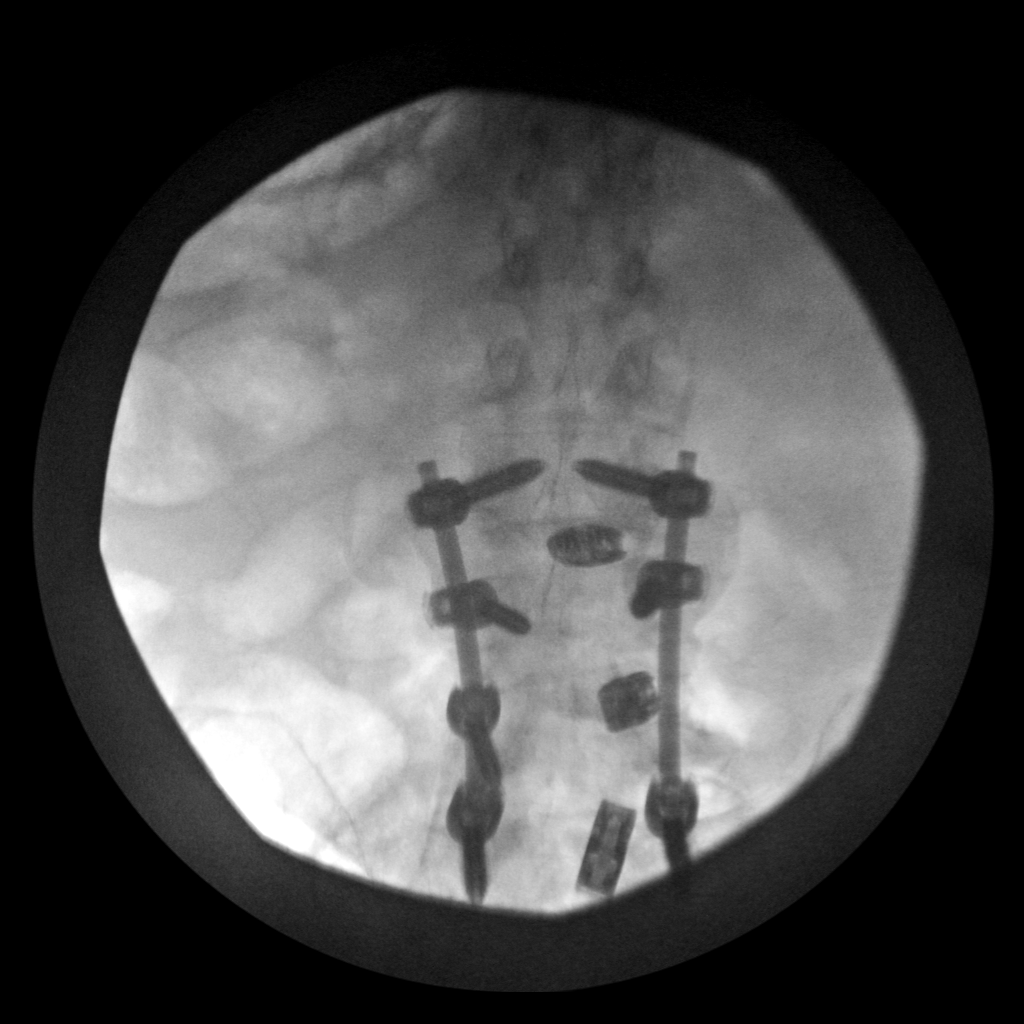

[4 of 4 positions shown; findings below may reference images not displayed]

FINDINGS: Four fluoroscopic images are obtained during the performance of the
procedure and are provided for interpretation only. Images
demonstrate previously noted posterior fusion hardware L4-S1, which
is removed and replaced with new pedicle screws, extending the
fusion now from L3-S1. No acute fracture.

Fluoroscopy time: 1 minute 39 seconds

36.02 mGy
IMPRESSION: Expected intraoperative appearance of revision and extension of
posterior fusion, now L3-S1.

## 2021-04-21 SURGERY — MINIMALLY INVASIVE (MIS) TRANSFORAMINAL LUMBAR INTERBODY FUSION (TLIF) 1 LEVEL
Anesthesia: General | Site: Spine Lumbar | Laterality: Left

## 2021-04-21 MED ORDER — ONDANSETRON HCL 4 MG/2ML IJ SOLN
INTRAMUSCULAR | Status: DC | PRN
Start: 2021-04-21 — End: 2021-04-21
  Administered 2021-04-21: 4 mg via INTRAVENOUS

## 2021-04-21 MED ORDER — ONDANSETRON HCL 4 MG/2ML IJ SOLN
INTRAMUSCULAR | Status: AC
  Filled 2021-04-21: qty 2

## 2021-04-21 MED ORDER — PHENYLEPHRINE 40 MCG/ML (10ML) SYRINGE FOR IV PUSH (FOR BLOOD PRESSURE SUPPORT)
PREFILLED_SYRINGE | INTRAVENOUS | Status: AC
  Filled 2021-04-21: qty 10

## 2021-04-21 MED ORDER — 0.9 % SODIUM CHLORIDE (POUR BTL) OPTIME
TOPICAL | Status: DC | PRN
  Administered 2021-04-21: 1000 mL

## 2021-04-21 MED ORDER — HEMOSTATIC AGENTS (NO CHARGE) OPTIME
TOPICAL | Status: DC | PRN
  Administered 2021-04-21: 1 via TOPICAL

## 2021-04-21 MED ORDER — THROMBIN 5000 UNITS EX SOLR: OROMUCOSAL | Status: DC | PRN

## 2021-04-21 MED ORDER — METFORMIN HCL 500 MG PO TABS
1000.0000 mg | ORAL_TABLET | Freq: Two times a day (BID) | ORAL | Status: DC
  Administered 2021-04-22 – 2021-04-23 (×3): 1000 mg via ORAL
  Filled 2021-04-21 (×3): qty 2

## 2021-04-21 MED ORDER — SUGAMMADEX SODIUM 200 MG/2ML IV SOLN
INTRAVENOUS | Status: DC | PRN
  Administered 2021-04-21: 200 mg via INTRAVENOUS

## 2021-04-21 MED ORDER — CHLORHEXIDINE GLUCONATE 0.12 % MT SOLN: 15.0000 mL | Freq: Once | OROMUCOSAL | Status: AC

## 2021-04-21 MED ORDER — PROPRANOLOL HCL 10 MG PO TABS
10.0000 mg | ORAL_TABLET | Freq: Two times a day (BID) | ORAL | Status: DC | PRN
  Filled 2021-04-21: qty 1

## 2021-04-21 MED ORDER — SUCCINYLCHOLINE CHLORIDE 200 MG/10ML IV SOSY
PREFILLED_SYRINGE | INTRAVENOUS | Status: AC
  Filled 2021-04-21: qty 10

## 2021-04-21 MED ORDER — THROMBIN 5000 UNITS EX SOLR
CUTANEOUS | Status: AC
  Filled 2021-04-21: qty 5000

## 2021-04-21 MED ORDER — OXYCODONE HCL 5 MG PO TABS
10.0000 mg | ORAL_TABLET | ORAL | Status: DC | PRN
  Administered 2021-04-21 – 2021-04-23 (×9): 10 mg via ORAL
  Filled 2021-04-21 (×9): qty 2

## 2021-04-21 MED ORDER — INSULIN ASPART 100 UNIT/ML IJ SOLN
0.0000 [IU] | Freq: Three times a day (TID) | INTRAMUSCULAR | Status: DC
  Administered 2021-04-22 (×2): 3 [IU] via SUBCUTANEOUS
  Administered 2021-04-22: 2 [IU] via SUBCUTANEOUS

## 2021-04-21 MED ORDER — ACETAMINOPHEN 650 MG RE SUPP: 650.0000 mg | RECTAL | Status: DC | PRN

## 2021-04-21 MED ORDER — IPRATROPIUM-ALBUTEROL 0.5-2.5 (3) MG/3ML IN SOLN: 3.0000 mL | Freq: Four times a day (QID) | RESPIRATORY_TRACT | Status: DC | PRN

## 2021-04-21 MED ORDER — MENTHOL 3 MG MT LOZG: 1.0000 | LOZENGE | OROMUCOSAL | Status: DC | PRN

## 2021-04-21 MED ORDER — ROSUVASTATIN CALCIUM 5 MG PO TABS
5.0000 mg | ORAL_TABLET | Freq: Every day | ORAL | Status: DC
  Administered 2021-04-22 – 2021-04-23 (×2): 5 mg via ORAL
  Filled 2021-04-21 (×2): qty 1

## 2021-04-21 MED ORDER — HYDROMORPHONE HCL 1 MG/ML IJ SOLN
INTRAMUSCULAR | Status: AC
  Filled 2021-04-21: qty 1

## 2021-04-21 MED ORDER — CEFAZOLIN SODIUM-DEXTROSE 2-4 GM/100ML-% IV SOLN
2.0000 g | Freq: Three times a day (TID) | INTRAVENOUS | Status: AC
  Administered 2021-04-21 – 2021-04-22 (×2): 2 g via INTRAVENOUS
  Filled 2021-04-21 (×2): qty 100

## 2021-04-21 MED ORDER — BUPIVACAINE LIPOSOME 1.3 % IJ SUSP
INTRAMUSCULAR | Status: DC | PRN
  Administered 2021-04-21: 20 mL

## 2021-04-21 MED ORDER — BUPIVACAINE LIPOSOME 1.3 % IJ SUSP
INTRAMUSCULAR | Status: AC
  Filled 2021-04-21: qty 20

## 2021-04-21 MED ORDER — MIDAZOLAM HCL 2 MG/2ML IJ SOLN
INTRAMUSCULAR | Status: AC
  Filled 2021-04-21: qty 2

## 2021-04-21 MED ORDER — LIDOCAINE 2% (20 MG/ML) 5 ML SYRINGE
INTRAMUSCULAR | Status: AC
  Filled 2021-04-21: qty 5

## 2021-04-21 MED ORDER — GLIPIZIDE 5 MG PO TABS
10.0000 mg | ORAL_TABLET | Freq: Every day | ORAL | Status: DC
Start: 2021-04-22 — End: 2021-04-23
  Administered 2021-04-22: 10 mg via ORAL
  Filled 2021-04-21: qty 2

## 2021-04-21 MED ORDER — ONDANSETRON HCL 4 MG PO TABS
4.0000 mg | ORAL_TABLET | Freq: Four times a day (QID) | ORAL | Status: DC | PRN
  Administered 2021-04-23: 4 mg via ORAL
  Filled 2021-04-21: qty 1

## 2021-04-21 MED ORDER — PANTOPRAZOLE SODIUM 40 MG PO TBEC
40.0000 mg | DELAYED_RELEASE_TABLET | Freq: Every day | ORAL | Status: DC
  Administered 2021-04-22 – 2021-04-23 (×2): 40 mg via ORAL
  Filled 2021-04-21 (×2): qty 1

## 2021-04-21 MED ORDER — CEFAZOLIN SODIUM-DEXTROSE 2-4 GM/100ML-% IV SOLN
2.0000 g | INTRAVENOUS | Status: AC
  Administered 2021-04-21: 2 g via INTRAVENOUS

## 2021-04-21 MED ORDER — LACTATED RINGERS IV SOLN: INTRAVENOUS | Status: DC

## 2021-04-21 MED ORDER — SUCCINYLCHOLINE CHLORIDE 200 MG/10ML IV SOSY
PREFILLED_SYRINGE | INTRAVENOUS | Status: DC | PRN
Start: 2021-04-21 — End: 2021-04-21
  Administered 2021-04-21: 120 mg via INTRAVENOUS

## 2021-04-21 MED ORDER — HYDROMORPHONE HCL 1 MG/ML IJ SOLN
0.2500 mg | INTRAMUSCULAR | Status: DC | PRN
  Administered 2021-04-21 (×3): 0.25 mg via INTRAVENOUS
  Administered 2021-04-21 (×2): 0.5 mg via INTRAVENOUS

## 2021-04-21 MED ORDER — PHENYLEPHRINE HCL-NACL 20-0.9 MG/250ML-% IV SOLN
INTRAVENOUS | Status: DC | PRN
  Administered 2021-04-21: 50 ug/min via INTRAVENOUS

## 2021-04-21 MED ORDER — PHENOL 1.4 % MT LIQD: 1.0000 | OROMUCOSAL | Status: DC | PRN

## 2021-04-21 MED ORDER — PROPOFOL 10 MG/ML IV BOLUS
INTRAVENOUS | Status: AC
  Filled 2021-04-21: qty 20

## 2021-04-21 MED ORDER — SODIUM CHLORIDE 0.9 % IV SOLN: 250.0000 mL | INTRAVENOUS | Status: DC

## 2021-04-21 MED ORDER — ACETAMINOPHEN 325 MG PO TABS
650.0000 mg | ORAL_TABLET | ORAL | Status: DC | PRN
  Administered 2021-04-22 – 2021-04-23 (×4): 650 mg via ORAL
  Filled 2021-04-21 (×5): qty 2

## 2021-04-21 MED ORDER — LIDOCAINE-EPINEPHRINE 1 %-1:100000 IJ SOLN
INTRAMUSCULAR | Status: AC
  Filled 2021-04-21: qty 1

## 2021-04-21 MED ORDER — BUPIVACAINE-EPINEPHRINE 0.5% -1:200000 IJ SOLN
INTRAMUSCULAR | Status: AC
  Filled 2021-04-21: qty 1

## 2021-04-21 MED ORDER — SODIUM CHLORIDE 0.9% FLUSH: 3.0000 mL | Freq: Two times a day (BID) | INTRAVENOUS | Status: DC

## 2021-04-21 MED ORDER — FENTANYL CITRATE (PF) 250 MCG/5ML IJ SOLN
INTRAMUSCULAR | Status: AC
  Filled 2021-04-21: qty 5

## 2021-04-21 MED ORDER — PROPOFOL 10 MG/ML IV BOLUS
INTRAVENOUS | Status: DC | PRN
Start: 2021-04-21 — End: 2021-04-21
  Administered 2021-04-21 (×3): 30 mg via INTRAVENOUS
  Administered 2021-04-21: 150 mg via INTRAVENOUS
  Administered 2021-04-21: 50 mg via INTRAVENOUS

## 2021-04-21 MED ORDER — EMPAGLIFLOZIN 25 MG PO TABS
25.0000 mg | ORAL_TABLET | Freq: Every day | ORAL | Status: DC
  Administered 2021-04-21 – 2021-04-23 (×3): 25 mg via ORAL
  Filled 2021-04-21 (×3): qty 1

## 2021-04-21 MED ORDER — CHLORHEXIDINE GLUCONATE 0.12 % MT SOLN
OROMUCOSAL | Status: AC
  Administered 2021-04-21: 15 mL via OROMUCOSAL
  Filled 2021-04-21: qty 15

## 2021-04-21 MED ORDER — LIDOCAINE 2% (20 MG/ML) 5 ML SYRINGE
INTRAMUSCULAR | Status: DC | PRN
Start: 2021-04-21 — End: 2021-04-21
  Administered 2021-04-21: 70 mg via INTRAVENOUS

## 2021-04-21 MED ORDER — HYDROCODONE-ACETAMINOPHEN 7.5-325 MG PO TABS
1.0000 | ORAL_TABLET | ORAL | Status: DC | PRN
  Administered 2021-04-22: 1 via ORAL
  Filled 2021-04-21: qty 1

## 2021-04-21 MED ORDER — THROMBIN 5000 UNITS EX SOLR
CUTANEOUS | Status: DC | PRN
  Administered 2021-04-21 (×2): 5000 [IU] via TOPICAL

## 2021-04-21 MED ORDER — INSULIN ASPART 100 UNIT/ML IJ SOLN
5.0000 [IU] | Freq: Once | INTRAMUSCULAR | Status: AC
  Administered 2021-04-21: 5 [IU] via SUBCUTANEOUS

## 2021-04-21 MED ORDER — SODIUM CHLORIDE 0.9% FLUSH: 3.0000 mL | INTRAVENOUS | Status: DC | PRN

## 2021-04-21 MED ORDER — DEXMEDETOMIDINE (PRECEDEX) IN NS 20 MCG/5ML (4 MCG/ML) IV SYRINGE
PREFILLED_SYRINGE | INTRAVENOUS | Status: DC | PRN
  Administered 2021-04-21: 8 ug via INTRAVENOUS
  Administered 2021-04-21: 12 ug via INTRAVENOUS

## 2021-04-21 MED ORDER — CEFAZOLIN SODIUM-DEXTROSE 2-4 GM/100ML-% IV SOLN
INTRAVENOUS | Status: AC
  Filled 2021-04-21: qty 100

## 2021-04-21 MED ORDER — MIDAZOLAM HCL 2 MG/2ML IJ SOLN
INTRAMUSCULAR | Status: DC | PRN
Start: 2021-04-21 — End: 2021-04-21
  Administered 2021-04-21: 2 mg via INTRAVENOUS

## 2021-04-21 MED ORDER — METHOCARBAMOL 1000 MG/10ML IJ SOLN
500.0000 mg | Freq: Four times a day (QID) | INTRAVENOUS | Status: DC | PRN
  Filled 2021-04-21: qty 5

## 2021-04-21 MED ORDER — INSULIN ASPART 100 UNIT/ML IJ SOLN
INTRAMUSCULAR | Status: AC
  Filled 2021-04-21: qty 1

## 2021-04-21 MED ORDER — PHENYLEPHRINE 40 MCG/ML (10ML) SYRINGE FOR IV PUSH (FOR BLOOD PRESSURE SUPPORT)
PREFILLED_SYRINGE | INTRAVENOUS | Status: DC | PRN
  Administered 2021-04-21: 50 ug via INTRAVENOUS
  Administered 2021-04-21 (×2): 120 ug via INTRAVENOUS
  Administered 2021-04-21: 30 ug via INTRAVENOUS
  Administered 2021-04-21: 80 ug via INTRAVENOUS

## 2021-04-21 MED ORDER — DEXAMETHASONE SODIUM PHOSPHATE 10 MG/ML IJ SOLN
INTRAMUSCULAR | Status: DC | PRN
Start: 2021-04-21 — End: 2021-04-21
  Administered 2021-04-21: 10 mg via INTRAVENOUS

## 2021-04-21 MED ORDER — DEXAMETHASONE SODIUM PHOSPHATE 10 MG/ML IJ SOLN
INTRAMUSCULAR | Status: AC
  Filled 2021-04-21: qty 1

## 2021-04-21 MED ORDER — ROCURONIUM BROMIDE 10 MG/ML (PF) SYRINGE
PREFILLED_SYRINGE | INTRAVENOUS | Status: AC
  Filled 2021-04-21: qty 10

## 2021-04-21 MED ORDER — DEXMEDETOMIDINE (PRECEDEX) IN NS 20 MCG/5ML (4 MCG/ML) IV SYRINGE
PREFILLED_SYRINGE | INTRAVENOUS | Status: AC
  Filled 2021-04-21: qty 5

## 2021-04-21 MED ORDER — GUAIFENESIN 200 MG PO TABS
400.0000 mg | ORAL_TABLET | Freq: Four times a day (QID) | ORAL | Status: DC | PRN
  Filled 2021-04-21: qty 2

## 2021-04-21 MED ORDER — FENTANYL CITRATE (PF) 250 MCG/5ML IJ SOLN
INTRAMUSCULAR | Status: DC | PRN
  Administered 2021-04-21: 50 ug via INTRAVENOUS
  Administered 2021-04-21: 100 ug via INTRAVENOUS
  Administered 2021-04-21 (×7): 50 ug via INTRAVENOUS

## 2021-04-21 MED ORDER — MOMETASONE FURO-FORMOTEROL FUM 100-5 MCG/ACT IN AERO
2.0000 | INHALATION_SPRAY | Freq: Two times a day (BID) | RESPIRATORY_TRACT | Status: DC
  Administered 2021-04-22 (×2): 2 via RESPIRATORY_TRACT
  Filled 2021-04-21: qty 8.8

## 2021-04-21 MED ORDER — ALPRAZOLAM 0.5 MG PO TABS
0.5000 mg | ORAL_TABLET | Freq: Two times a day (BID) | ORAL | Status: DC | PRN
  Administered 2021-04-22: 0.5 mg via ORAL
  Filled 2021-04-21: qty 1

## 2021-04-21 MED ORDER — INSULIN ASPART 100 UNIT/ML IJ SOLN
0.0000 [IU] | Freq: Every day | INTRAMUSCULAR | Status: DC
  Administered 2021-04-21: 2 [IU] via SUBCUTANEOUS

## 2021-04-21 MED ORDER — METHOCARBAMOL 500 MG PO TABS
500.0000 mg | ORAL_TABLET | Freq: Four times a day (QID) | ORAL | Status: DC | PRN
  Administered 2021-04-21 – 2021-04-23 (×5): 500 mg via ORAL
  Filled 2021-04-21 (×5): qty 1

## 2021-04-21 MED ORDER — HYDROMORPHONE HCL 1 MG/ML IJ SOLN
0.5000 mg | INTRAMUSCULAR | Status: DC | PRN
  Administered 2021-04-21: 0.5 mg via INTRAVENOUS
  Filled 2021-04-21: qty 0.5

## 2021-04-21 MED ORDER — ORAL CARE MOUTH RINSE: 15.0000 mL | Freq: Once | OROMUCOSAL | Status: AC

## 2021-04-21 MED ORDER — DOCUSATE SODIUM 100 MG PO CAPS
100.0000 mg | ORAL_CAPSULE | Freq: Two times a day (BID) | ORAL | Status: DC
  Administered 2021-04-21 – 2021-04-23 (×4): 100 mg via ORAL
  Filled 2021-04-21 (×4): qty 1

## 2021-04-21 MED ORDER — CHLORHEXIDINE GLUCONATE CLOTH 2 % EX PADS: 6.0000 | MEDICATED_PAD | Freq: Once | CUTANEOUS | Status: DC

## 2021-04-21 MED ORDER — ALBUTEROL SULFATE (2.5 MG/3ML) 0.083% IN NEBU: 3.0000 mL | INHALATION_SOLUTION | Freq: Three times a day (TID) | RESPIRATORY_TRACT | Status: DC | PRN

## 2021-04-21 MED ORDER — PIOGLITAZONE HCL 30 MG PO TABS
30.0000 mg | ORAL_TABLET | Freq: Every day | ORAL | Status: DC
  Administered 2021-04-21 – 2021-04-23 (×3): 30 mg via ORAL
  Filled 2021-04-21 (×3): qty 1

## 2021-04-21 MED ORDER — ARFORMOTEROL TARTRATE 15 MCG/2ML IN NEBU
15.0000 ug | INHALATION_SOLUTION | Freq: Two times a day (BID) | RESPIRATORY_TRACT | Status: DC
  Administered 2021-04-22: 15 ug via RESPIRATORY_TRACT
  Filled 2021-04-21 (×6): qty 2

## 2021-04-21 MED ORDER — NITROGLYCERIN 0.4 MG SL SUBL: 0.4000 mg | SUBLINGUAL_TABLET | SUBLINGUAL | Status: DC | PRN

## 2021-04-21 MED ORDER — ONDANSETRON HCL 4 MG/2ML IJ SOLN
4.0000 mg | Freq: Four times a day (QID) | INTRAMUSCULAR | Status: DC | PRN
  Administered 2021-04-22: 4 mg via INTRAVENOUS
  Filled 2021-04-21: qty 2

## 2021-04-21 MED ORDER — SEMAGLUTIDE (1 MG/DOSE) 2 MG/1.5ML ~~LOC~~ SOPN: 1.0000 mg | PEN_INJECTOR | SUBCUTANEOUS | Status: DC

## 2021-04-21 MED ORDER — ROCURONIUM BROMIDE 10 MG/ML (PF) SYRINGE
PREFILLED_SYRINGE | INTRAVENOUS | Status: DC | PRN
  Administered 2021-04-21 (×2): 30 mg via INTRAVENOUS
  Administered 2021-04-21: 70 mg via INTRAVENOUS
  Administered 2021-04-21: 30 mg via INTRAVENOUS

## 2021-04-21 SURGICAL SUPPLY — 74 items
BAG COUNTER SPONGE SURGICOUNT (BAG) ×2 IMPLANT
BAND RUBBER #18 3X1/16 STRL (MISCELLANEOUS) ×4 IMPLANT
BASKET BONE COLLECTION (BASKET) ×1 IMPLANT
BUR MATCHSTICK NEURO 3.0 LAGG (BURR) ×2 IMPLANT
CAGE TLIF MOD 7X10X25 4D (Cage) ×1 IMPLANT
CNTNR URN SCR LID CUP LEK RST (MISCELLANEOUS) ×1 IMPLANT
CONT SPEC 4OZ STRL OR WHT (MISCELLANEOUS) ×2
COVER BACK TABLE 60X90IN (DRAPES) ×2 IMPLANT
DECANTER SPIKE VIAL GLASS SM (MISCELLANEOUS) ×1 IMPLANT
DERMABOND ADVANCED (GAUZE/BANDAGES/DRESSINGS) ×1
DERMABOND ADVANCED .7 DNX12 (GAUZE/BANDAGES/DRESSINGS) IMPLANT
DRAIN JACKSON RD 7FR 3/32 (WOUND CARE) IMPLANT
DRAPE C-ARM 42X72 X-RAY (DRAPES) ×4 IMPLANT
DRAPE C-ARMOR (DRAPES) ×1 IMPLANT
DRAPE LAPAROTOMY 100X72X124 (DRAPES) ×2 IMPLANT
DRAPE MICROSCOPE LEICA (MISCELLANEOUS) ×2 IMPLANT
DRAPE STERI IOBAN 125X83 (DRAPES) IMPLANT
DRAPE SURG 17X23 STRL (DRAPES) ×2 IMPLANT
DRSG OPSITE POSTOP 4X6 (GAUZE/BANDAGES/DRESSINGS) ×2 IMPLANT
DURAPREP 26ML APPLICATOR (WOUND CARE) ×2 IMPLANT
ELECT BLADE INSULATED 6.5IN (ELECTROSURGICAL) ×2
ELECT REM PT RETURN 9FT ADLT (ELECTROSURGICAL) ×2
ELECTRODE BLDE INSULATED 6.5IN (ELECTROSURGICAL) ×1 IMPLANT
ELECTRODE REM PT RTRN 9FT ADLT (ELECTROSURGICAL) ×1 IMPLANT
EVACUATOR 1/8 PVC DRAIN (DRAIN) ×1 IMPLANT
GAUZE 4X4 16PLY ~~LOC~~+RFID DBL (SPONGE) ×2 IMPLANT
GAUZE SPONGE 4X4 12PLY STRL (GAUZE/BANDAGES/DRESSINGS) ×2 IMPLANT
GLOVE SRG 8 PF TXTR STRL LF DI (GLOVE) ×2 IMPLANT
GLOVE SURG LTX SZ7 (GLOVE) ×4 IMPLANT
GLOVE SURG LTX SZ8 (GLOVE) ×5 IMPLANT
GLOVE SURG UNDER POLY LF SZ7 (GLOVE) ×1 IMPLANT
GLOVE SURG UNDER POLY LF SZ8 (GLOVE) ×2
GLOVE SURG UNDER POLY LF SZ8.5 (GLOVE) ×1 IMPLANT
GOWN STRL REUS W/ TWL LRG LVL3 (GOWN DISPOSABLE) IMPLANT
GOWN STRL REUS W/ TWL XL LVL3 (GOWN DISPOSABLE) ×1 IMPLANT
GOWN STRL REUS W/TWL 2XL LVL3 (GOWN DISPOSABLE) ×1 IMPLANT
GOWN STRL REUS W/TWL LRG LVL3 (GOWN DISPOSABLE) ×2
GOWN STRL REUS W/TWL XL LVL3 (GOWN DISPOSABLE) ×1
GUIDEWIRE NITINOL BEVEL TIP (WIRE) ×8 IMPLANT
HEMOSTAT POWDER KIT SURGIFOAM (HEMOSTASIS) ×1 IMPLANT
KIT BASIN OR (CUSTOM PROCEDURE TRAY) ×2 IMPLANT
KIT INFUSE XX SMALL 0.7CC (Orthopedic Implant) ×1 IMPLANT
KIT POSITION SURG JACKSON T1 (MISCELLANEOUS) ×2 IMPLANT
KIT TURNOVER KIT B (KITS) ×2 IMPLANT
MARKER SKIN DUAL TIP RULER LAB (MISCELLANEOUS) ×2 IMPLANT
NDL BIOPSY DD SERENGETI 8G (NEEDLE) IMPLANT
NDL HYPO 25X1 1.5 SAFETY (NEEDLE) ×1 IMPLANT
NEEDLE BIOPSY DD SERENGETI 8G (NEEDLE) ×2 IMPLANT
NEEDLE HYPO 25X1 1.5 SAFETY (NEEDLE) ×2 IMPLANT
NS IRRIG 1000ML POUR BTL (IV SOLUTION) ×2 IMPLANT
PACK LAMINECTOMY NEURO (CUSTOM PROCEDURE TRAY) ×2 IMPLANT
PAD ARMBOARD 7.5X6 YLW CONV (MISCELLANEOUS) ×9 IMPLANT
PATTIES SURGICAL .5 X.5 (GAUZE/BANDAGES/DRESSINGS) IMPLANT
PATTIES SURGICAL .5 X1 (DISPOSABLE) IMPLANT
PATTIES SURGICAL 1X1 (DISPOSABLE) IMPLANT
RASP 3.0MM (RASP) ×1 IMPLANT
ROD RELINE MAS LORD 5.5X90MM (Rod) ×2 IMPLANT
SCREW LOCK RELINE 5.5 TULIP (Screw) ×7 IMPLANT
SCREW RED MAS POLY 7.5X40MM (Screw) ×2 IMPLANT
SCREW RED RELINE 7.5X45MM POLY (Screw) ×2 IMPLANT
SCREW RELINE MAS RED 7.5X50MM (Screw) ×1 IMPLANT
SCREW RELINE RED 6.5X50MM POLY (Screw) ×2 IMPLANT
SEALANT ADHERUS EXTEND TIP (MISCELLANEOUS) ×1 IMPLANT
SPONGE SURGIFOAM ABS GEL SZ50 (HEMOSTASIS) ×1 IMPLANT
SPONGE T-LAP 4X18 ~~LOC~~+RFID (SPONGE) ×2 IMPLANT
STAPLER VISISTAT 35W (STAPLE) IMPLANT
SUT VIC AB 0 CT1 27 (SUTURE) ×1
SUT VIC AB 0 CT1 27XBRD ANBCTR (SUTURE) ×1 IMPLANT
SUT VIC AB 2-0 CP2 18 (SUTURE) ×3 IMPLANT
SUT VIC AB 3-0 SH 8-18 (SUTURE) ×1 IMPLANT
TOWEL GREEN STERILE (TOWEL DISPOSABLE) IMPLANT
TOWEL GREEN STERILE FF (TOWEL DISPOSABLE) IMPLANT
TRAY FOLEY MTR SLVR 16FR STAT (SET/KITS/TRAYS/PACK) ×2 IMPLANT
WATER STERILE IRR 1000ML POUR (IV SOLUTION) ×2 IMPLANT

## 2021-04-21 NOTE — H&P (Signed)
Providing Compassionate, Quality Care - Together  NEUROSURGERY HISTORY & PHYSICAL   Bryan Kent is an 71 y.o. male.   Chief Complaint: Low back pain with left lower extremity radiculopathy HPI: This is a 71 year old male with a history of pulmonary disease, cardiac disease, L4-S1 fusion ago that complains of worsening low back pain, radiating pain into his left leg and left groin.  This has been progressively worsening despite epidural steroid injections and conservative measures.  MRI revealed severe stenosis at L3-4 with large left disc rupture in the lateral recess causing severe stenosis.  Given this he presents today for surgical decompression, instrumentation and fusion at L3-4 with revision fusion L4-S1.  Past Medical History:  Diagnosis Date   Arthritis    Cancer ([place])    COPD (chronic obstructive pulmonary disease) ([place])    Coronary artery disease    Diabetes mellitus without complication ([place])    Difficult intubation    Glidescope used [facility] 12/02/16, grade 3 view   Hyperlipidemia    Hypertension    PTSD (post-traumatic stress disorder)    Rheumatoid arthritis ([place])     Past Surgical History:  Procedure Laterality Date   BACK SURGERY     fusion   CORONARY ATHERECTOMY N/A 06/19/2020   Procedure: CORONARY ATHERECTOMY;  Surgeon: Lorretta Harp, MD;  Location: [facility];  Service: Cardiovascular;  Laterality: N/A;   CORONARY PRESSURE WIRE/FFR WITH 3D MAPPING N/A 06/19/2020   Procedure: Coronary Pressure Wire/FFR w/3D Mapping;  Surgeon: Lorretta Harp, MD;  Location: [facility];  Service: Cardiovascular;  Laterality: N/A;   CORONARY STENT INTERVENTION  06/19/2020   CORONARY STENT INTERVENTION N/A 06/19/2020   Procedure: CORONARY STENT INTERVENTION;  Surgeon: Lorretta Harp, MD;  Location: [facility];  Service: Cardiovascular;  Laterality: N/A;   gastric ulcer     INTRAVASCULAR IMAGING/OCT N/A 06/19/2020   Procedure: INTRAVASCULAR  IMAGING/OCT;  Surgeon: Lorretta Harp, MD;  Location: [facility];  Service: Cardiovascular;  Laterality: N/A;   KNEE SURGERY     LEFT HEART CATH AND CORONARY ANGIOGRAPHY N/A 06/19/2020   Procedure: LEFT HEART CATH AND CORONARY ANGIOGRAPHY;  Surgeon: Lorretta Harp, MD;  Location: [facility];  Service: Cardiovascular;  Laterality: N/A;   REPAIR OF PERFORATED ULCER     ROTATOR CUFF REPAIR      Family History  Problem Relation Age of Onset   Heart disease Father    Asthma Mother    Social History:  reports that he has quit smoking. His smoking use included cigarettes. He has a 35.00 pack-year smoking history. He has never used smokeless tobacco. He reports that he does not currently use alcohol. He reports that he does not use drugs.  Allergies:  Allergies  Allergen Reactions   Lobster [Shellfish Allergy] Hives   Benzalkonium Chloride Rash   Pravastatin Rash   Simvastatin Rash   Neosporin [Bacitracin-Polymyxin B]     Medications Prior to Admission  Medication Sig Dispense Refill   albuterol (ACCUNEB) 0.63 MG/3ML nebulizer solution Take 1 ampule by nebulization in the morning and at bedtime.     ALPRAZolam (XANAX) 0.5 MG tablet Take 0.5 mg by mouth 2 (two) times daily as needed for anxiety.     arformoterol (BROVANA) 15 MCG/2ML NEBU Take 15 mcg by nebulization 2 (two) times daily.     aspirin EC 81 MG tablet Take 81 mg by mouth daily. Swallow whole.  azithromycin (ZITHROMAX) 250 MG tablet Take 250 mg by mouth every Monday, Wednesday, and Friday.     budesonide-formoterol (SYMBICORT) 80-4.5 MCG/ACT inhaler Inhale 1 puff into the lungs in the morning and at bedtime.     clopidogrel (PLAVIX) 75 MG tablet Take 1 tablet (75 mg total) by mouth daily. 30 tablet 5   empagliflozin (JARDIANCE) 25 MG TABS tablet Take 25 mg by mouth daily.     folic acid (FOLVITE) 1 MG tablet Take 1 mg by mouth daily.      glipiZIDE (GLUCOTROL) 10 MG tablet Take 10 mg by mouth daily with  supper.     guaifenesin (HUMIBID E) 400 MG TABS tablet Take 400 mg by mouth every 6 (six) hours as needed (cough/congestion).     hydroxychloroquine (PLAQUENIL) 200 MG tablet Take 200 mg by mouth daily.     Ipratropium-Albuterol (COMBIVENT) 20-100 MCG/ACT AERS respimat Inhale 1 puff into the lungs every 6 (six) hours as needed for wheezing or shortness of breath. 4 g 5   metFORMIN (GLUCOPHAGE) 1000 MG tablet Take 1,000 mg by mouth 2 (two) times daily with a meal.     methotrexate (RHEUMATREX) 2.5 MG tablet Take 20 mg by mouth every Saturday.     nitroGLYCERIN (NITROSTAT) 0.4 MG SL tablet PLACE 1 TABLET (0.4 MG TOTAL) UNDER THE TONGUE EVERY FIVE MINUTES AS NEEDED FOR CHEST PAIN. 25 tablet 0   pantoprazole (PROTONIX) 40 MG tablet TAKE 1 TABLET (40 MG TOTAL) BY MOUTH DAILY. 30 tablet 0   pioglitazone (ACTOS) 30 MG tablet Take 30 mg by mouth daily.      predniSONE (DELTASONE) 5 MG tablet Take 5 mg by mouth daily as needed (rheumatoid arthritis).     propranolol (INDERAL) 10 MG tablet Take 10 mg by mouth 2 (two) times daily as needed (anxiety/PTSD).     rosuvastatin (CRESTOR) 5 MG tablet Take 1 tablet (5 mg total) by mouth daily. 30 tablet 0   Semaglutide (OZEMPIC, 1 MG/DOSE, [place]) Inject 1 mg into the skin once a week. Saturday      Results for orders placed or performed during the hospital encounter of 04/21/21 (from the past 48 hour(s))  Glucose, capillary     Status: Abnormal   Collection Time: 04/21/21 10:54 AM  Result Value Ref Range   Glucose-Capillary 140 (H) 70 - 99 mg/dL    Comment: Glucose reference range applies only to samples taken after fasting for at least 8 hours.  ABO/Rh     Status: None   Collection Time: 04/21/21 11:27 AM  Result Value Ref Range   ABO/RH(D)      A NEG Performed at [facility] [address], [place], [place] [address]   Glucose, capillary     Status: Abnormal   Collection Time: 04/21/21 12:54 PM  Result Value Ref Range   Glucose-Capillary 119 (H)  70 - 99 mg/dL    Comment: Glucose reference range applies only to samples taken after fasting for at least 8 hours.   No results found.  ROS All positives and negatives listed in HPI above  Blood pressure (!) 141/88, pulse 100, temperature 98.5 F (36.9 C), temperature source Oral, resp. rate 18, height 5\' 8"  (1.727 m), weight 73.5 kg, SpO2 100 %. 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  He received medical and pulmonology clearance as well as cardiology clearance.  He has been off of his dual antiplatelet therapy.  We discussed all risks, benefits and expected outcomes, informed consent was obtained.  He agrees to proceed with surgical intervention.    Thank you for allowing me to participate in this patient's care.  Please do not hesitate to call with questions or concerns.   Elwin Sleight, [facility] Cell: [phone]

## 2021-04-21 NOTE — Anesthesia Procedure Notes (Signed)
Procedure Name: Intubation Date/Time: 04/21/2021 2:12 PM Performed by: Janace Litten, CRNA Pre-anesthesia Checklist: Patient identified, Emergency Drugs available, Suction available and Patient being monitored Patient Re-evaluated:Patient Re-evaluated prior to induction Oxygen Delivery Method: Circle System Utilized Preoxygenation: Pre-oxygenation with 100% oxygen Induction Type: IV induction Laryngoscope Size: Glidescope and 4 Grade View: Grade I Tube type: Oral Tube size: 7.5 mm Number of attempts: 1 Airway Equipment and Method: Stylet and Video-laryngoscopy Placement Confirmation: ETT inserted through vocal cords under direct vision, positive ETCO2 and breath sounds checked- equal and bilateral Secured at: 23 cm Tube secured with: Tape Dental Injury: Teeth and Oropharynx as per pre-operative assessment  Difficulty Due To: Difficulty was anticipated Future Recommendations: Recommend- induction with short-acting agent, and alternative techniques readily available Comments: Elective glidescope due to history of "difficult intubation"

## 2021-04-21 NOTE — Op Note (Signed)
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, adjacent segment disease, with left lower extremity radiculopathy History of fusion, L4-S1   POSTOP DIAGNOSIS: Same   PROCEDURE: Minimally invasive L3-4 decompression and transforaminal lumbar interbody fusion and arthrodesis, left sided approach Exploration of fusion L4-5, L5-S1 with revision of hardware (removal of DePuy, replacement with NuVasive pedicle screw construct) Bilateral L3, L4, L5, S1 segmental pedicle screw instrumentation, NuVasive (L3 7.5 x 45 mm bilaterally, L4 7.5 x 50 mm on the left, 6.5 x 50 mm on the right, L5 6.5 x 50 mm on the right, S1 7.5 x 40 mm bilaterally) Placement of anterior biomechanical device, NuVasive titanium 7 x 10 x 25 mm interbody device Intraoperative use of autograft, same incision Intraoperative use of allograft  Intraoperative use of BMP Intraoperative use of fluoroscopy, greater than 1 hour Intraoperative use of microscope, for microdissection   SURGEON: Dr. Pieter Partridge C. Trooper Olander, DO   ASSISTANT: Dr. Consuella Lose, MD; Weston Brass, NP   ANESTHESIA: General Endotracheal   EBL: 250 cc   SPECIMENS: None   DRAINS: Medium Hemovac   COMPLICATIONS: None   CONDITION: Hemodynamically stable   HISTORY: Bryan Kent is a 71 y.o. y.o. male who initially presented to the outpatient clinic with signs and symptoms consistent with chronic low back pain, neurogenic claudication and left lower extremity radiculopathy in the L3 and L4 dermatomes. MRI demonstrated severe stenosis at L3-4 with bilateral facet hypertrophy causing severe stenosis as well as a left eccentric lateral recess disc protrusion with cephalad migration causing severe L3 and L4 stenosis. Treatment options were discussed including epidural steroid injection, physical therapy and pain control.  He underwent multiple conservative measures including pain control  and steroid injections which helped temporarily.  Given his return of severe pain and difficulty walking due to his neurogenic claudication, I recommended surgical decompression, instrumentation and fusion at L3-4 with exploration of fusion L4-S1. All risks, benefits and expected outcomes were discussed agreed upon.   PROCEDURE IN DETAIL: The patient was brought to the operating room. After induction of general anesthesia, the patient was positioned on the operative table in the prone position on the [place] table. All pressure points were meticulously padded. Skin incision was then marked out and prepped and draped in the usual sterile fashion. Physician driven timeout was performed.   Using biplanar fluoroscopy, the C arm for sterilely draped and brought into the field and the paramedian incisions were planned over the L3-4 interspace.  Local anesthetic was injected bilaterally.  Using a 10 blade, paramedian incision was created bilaterally.  Using Jamshidi, the bilateral L3 pedicles were accessed under biplanar fluoroscopy.  K wires were placed and the Jamshidi's were removed.  The paramedian incisions were extended inferiorly bilaterally.  Using Bovie electrocautery, dissection was performed down to the previous hardware at L4-S1 bilaterally.  The previous L4 pedicle screws, L5, S1 screws, rods and cross-link were removed without issue.  K wires were placed in the previous pedicle screw tracks.  Attention was then turned to docking the Metrix dilator.  Using with a series of dilators, on the patient's left side the facet lamina junction was docked with a 26 x 7 mm tube.  This was confirmed to be in the appropriate trajectory under lateral fluoroscopy.  At this point the microscope was sterilely draped and brought into the field.   Using Bovie electrocautery, soft tissue was cleared from the lamina and facet at L3-4.  Using a high-speed drill and collecting the autograft, a laminectomy and complete  facetectomy was performed down to the ligamentum flavum.  This autograft was saved for later.  The ligamentum flavum was identified to its attachment along the superior lamina and lateral pars.  Using a series of micro curettes, the ligamentum flavum was gently elevated and the epidural space was identified.  The ligamentum flavum was completely resected using Kerrison rongeurs.  The traversing and exiting nerve roots were identified.  The ligamentum flavum was significantly adherent and a small durotomy was noted.  The arachnoid remained intact and there is no egress of CSF.  Using Kerrison rongeurs, the common dural tube and traversing and exiting nerve roots were completely decompressed from the ligamentum flavum.  They appeared pulsatile and noncompressed.  Camden's triangle was identified, the traversing nerve root was gently retracted medially, and the epidural space was coagulated and cleared of epidural veins.  The disc space was identified.  The disc annulus was then coagulated and incised with an 11 blade.  Using Kerrison rongeurs the annulotomy was widened.  Using a series of disc prep shavers and curettes a radical discectomy was performed to subchondral bleeding bone at both endplates.  The disc base was then trialed and found to be in 7 x 10 x 25 mm 4 degree interbody size.  A mixture of autograft and allograft was then placed anteriorly with BMP and medially and packed with a bone tamp.  Using a nerve root retractor, the traversing nerve root was gently protected during placement of the interbody.   Using lateral fluoroscopy, the interbody was then placed under live fluoroscopy.  Appropriate placement was identified.  The remainder of the autograft and allograft and BMP was then placed laterally and the intervertebral space to the interbody device.  Hemostasis was achieved with passive hemostatics and bipolar cautery.  The traversing nerve root, neural tube and exiting nerve root were followed with  a ball-tipped probe and noted to be noncompressed, pulsatile and in their normal position.  Adherus was placed over the durotomy site.  There was no egress of CSF.  A few pieces of Gelfoam with thrombin were placed over the thecal sac.  The Metrix dilator tube was gently removed and hemostasis was achieved with bipolar cautery and the soft tissues.   The above listed pedicle screws were then placed under lateral fluoroscopy over the K wires bilaterally at L3, L4, S1.  During placement of the left L5 pedicle screw it appeared to take a different trajectory than the previous pedicle screw therefore it was removed and left out.  A right L5 pedicle screw was placed as listed above to the appropriate size.  Appropriate sized rods were measured, contoured and placed percutaneously.  These were confirmed to be within the tulips, setscrews were placed and final tightened to the manufacturer's recommendations.  Percutaneous towers were removed from the screws.  Final AP and lateral fluoroscopy images confirmed appropriate placement of all hardware.  Hemostasis was achieved with bipolar cautery.  A medium Hemovac was tunneled subfascially bilaterally.  The wound was closed in layers, 2-0 and 3-0 Vicryl sutures for the dermis.  Skin was closed with skin glue.  Sterile dressing was applied.   At the end of the case all sponge, needle, and instrument counts were correct. The patient was then transferred to the stretcher, extubated, and taken to the post-anesthesia care unit in stable hemodynamic condition.

## 2021-04-21 NOTE — Transfer of Care (Signed)
Immediate Anesthesia Transfer of Care Note  Patient: Bryan Kent  Procedure(s) Performed: Minimally Invasive  Left, Lumbar three- lumbar four Transforaminal Lumbar Interbody Fusion With Revision of hardware, Exploration of fusion Lumbar four - lumbar five, Lumbar five- Sacral one (Left: Spine Lumbar)  Patient Location: PACU  Anesthesia Type:General  Level of Consciousness: awake and alert   Airway & Oxygen Therapy: Patient Spontanous Breathing and Patient connected to face mask oxygen  Post-op Assessment: Report given to RN, Post -op Vital signs reviewed and stable and Patient moving all extremities X 4  Post vital signs: Reviewed and stable  Last Vitals:  Vitals Value Taken Time  BP 153/82 04/21/21 1910  Temp 36.6 C 04/21/21 1910  Pulse 87 04/21/21 1913  Resp 20 04/21/21 1913  SpO2 97 % 04/21/21 1913  Vitals shown include unvalidated device data.  Last Pain:  Vitals:   04/21/21 1117  TempSrc:   PainSc: 8       Patients Stated Pain Goal: 0 (98/72/15 8727)  Complications: No notable events documented.

## 2021-04-22 ENCOUNTER — Encounter (HOSPITAL_COMMUNITY): Payer: Self-pay | Admitting: Neurological Surgery

## 2021-04-22 LAB — GLUCOSE, CAPILLARY
Glucose-Capillary: 193 mg/dL — ABNORMAL HIGH (ref 70–99)
Glucose-Capillary: 128 mg/dL — ABNORMAL HIGH (ref 70–99)
Glucose-Capillary: 185 mg/dL — ABNORMAL HIGH (ref 70–99)
Glucose-Capillary: 128 mg/dL — ABNORMAL HIGH (ref 70–99)

## 2021-04-22 MED ORDER — HYDROMORPHONE HCL 1 MG/ML IJ SOLN
0.5000 mg | INTRAMUSCULAR | Status: DC | PRN
  Administered 2021-04-22: 0.5 mg via INTRAMUSCULAR
  Filled 2021-04-22: qty 0.5

## 2021-04-22 NOTE — Evaluation (Signed)
Physical Therapy Evaluation and Discharge Patient Details Name: Bryan Kent MRN: [id number] DOB: [birth year] Today's Date: 04/22/2021  History of Present Illness  71 y.o. male presenting for elective L3-4 TLIF and exploration of L4-5 and L5-S1 fusion. PMHx significant for arthritis, Hx of cancer (unspecified), COPD, CAD, DMII, HLD, HTN, PTSD and RA.  Clinical Impression  Patient evaluated by Physical Therapy with no further acute PT needs identified. Pt reports resolved LLE radicular symptoms and fair pain control post op. Pt overall is mobilizing well, ambulating 400 feet with a walker modI. SpO2 95-98% on RA. Pt lives alone, but he is fairly active prior to admission, and enjoys hunting and fishing. Suspect excellent progress. Education provided regarding spinal precautions and activity recommendations. All education has been completed and the patient has no further questions. No follow-up Physical Therapy or equipment needs. PT is signing off. Thank you for this referral.      Recommendations for follow up therapy are one component of a multi-disciplinary discharge planning process, led by the attending physician.  Recommendations may be updated based on patient status, additional functional criteria and insurance authorization.  Follow Up Recommendations No PT follow up    Assistance Recommended at Discharge PRN  Patient can return home with the following       Equipment Recommendations None recommended by PT   Recommendations for Other Services       Functional Status Assessment Patient has had a recent decline in their functional status and demonstrates the ability to make significant improvements in function in a reasonable and predictable amount of time.     Precautions / Restrictions Precautions Precautions: Back;Fall Precaution Booklet Issued: No Precaution Comments: Verbally reviewed back precautions. Patient able to recall 3/3 back precuations with increased time.  Requires cues for adherence during ADLs. Required Braces or Orthoses:  (No brace required) Restrictions Weight Bearing Restrictions: No      Mobility  Bed Mobility Overal bed mobility: Modified Independent             General bed mobility comments: good log roll technique    Transfers Overall transfer level: Modified independent Equipment used: Rolling walker (2 wheels) Transfers: Sit to/from Stand Sit to Stand: Min guard           General transfer comment: Min guard for steadying. Cue for hand placement.    Ambulation/Gait Ambulation/Gait assistance: Modified independent (Device/Increase time) Gait Distance (Feet): 400 Feet Assistive device: Rolling walker (2 wheels) Gait Pattern/deviations: WFL(Within Functional Limits)       General Gait Details: Lightly utilizing walker, difficulty dual tasking, no gross imbalance  Stairs            Wheelchair Mobility    Modified Rankin (Stroke Patients Only)       Balance Overall balance assessment: Mild deficits observed, not formally tested                                           Pertinent Vitals/Pain Pain Assessment: Faces Pain Score: 6  Faces Pain Scale: Hurts a little bit Pain Location: Low back (incisional) Pain Descriptors / Indicators: Aching;Sore;Grimacing Pain Intervention(s): Monitored during session    Home Living Family/patient expects to be discharged to:: Private residence Living Arrangements: Alone Available Help at Discharge: Other (Comment) (None per patient) Type of Home: House Home Access: Level entry         Home Equipment:  Rolling Walker (2 wheels);Shower seat - built in      Prior Function Prior Level of Function : Independent/Modified Independent;Driving                     Journalist, newspaper        Extremity/Trunk Assessment   Upper Extremity Assessment Upper Extremity Assessment: Defer to OT evaluation    Lower Extremity  Assessment Lower Extremity Assessment: RLE deficits/detail;LLE deficits/detail RLE Deficits / Details: Strength 5/5 LLE Deficits / Details: Strength 5/5    Cervical / Trunk Assessment Cervical / Trunk Assessment: Back Surgery  Communication   Communication: No difficulties  Cognition Arousal/Alertness: Awake/alert Behavior During Therapy: WFL for tasks assessed/performed Overall Cognitive Status: Within Functional Limits for tasks assessed                                          General Comments      Exercises     Assessment/Plan    PT Assessment Patient does not need any further PT services  PT Problem List         PT Treatment Interventions      PT Goals (Current goals can be found in the Care Plan section)  Acute Rehab PT Goals Patient Stated Goal: go home PT Goal Formulation: All assessment and education complete, DC therapy    Frequency       Co-evaluation               AM-PAC PT "6 Clicks" Mobility  Outcome Measure Help needed turning from your back to your side while in a flat bed without using bedrails?: None Help needed moving from lying on your back to sitting on the side of a flat bed without using bedrails?: None Help needed moving to and from a bed to a chair (including a wheelchair)?: None Help needed standing up from a chair using your arms (e.g., wheelchair or bedside chair)?: None Help needed to walk in hospital room?: None Help needed climbing 3-5 steps with a railing? : A Little 6 Click Score: 23    End of Session   Activity Tolerance: Patient tolerated treatment well Patient left: Other (comment) (handoff to OT) Nurse Communication: Mobility status PT Visit Diagnosis: Pain Pain - part of body:  (back)    Time: 4097-3532 PT Time Calculation (min) (ACUTE ONLY): 33 min   Charges:   PT Evaluation $PT Eval Low Complexity: 1 Low PT Treatments $Therapeutic Activity: 8-22 mins        Wyona Almas, PT,  DPT Acute Rehabilitation Services Pager [phone] Office [phone]   Deno Etienne 04/22/2021, 10:22 AM

## 2021-04-22 NOTE — Evaluation (Signed)
Occupational Therapy Evaluation Patient Details Name: Bryan Kent MRN: [id number] DOB: 10/20/1950 Today's Date: 04/22/2021   History of Present Illness 71 y.o. male presenting for elective L3-4 TLIF and exploration of L4-5 and L5-S1 fusion. PMHx significant for arthritis, Hx of cancer (unspecified), COPD, CAD, DMII, HLD, HTN, PTSD and RA.   Clinical Impression   PTA patient was living alone in a private residence with his service dog and was grossly independent with ADLs/IADLs with PRN use of AD. Patient currently presents near baseline level of function demonstrating observed ADLs, functional transfers and mobility with supervision to [place] guard with use of RW. OT provided education on spinal precautions, home set-up to maximize safety and independence with self-care tasks, and acquisition/use of AE. Patient expressed verbal understanding but would benefit from continued education. Patient limited by deficits listed below and would benefit from continued acute OT services in prep for safe d/c home. OT will continue to follow acutely.         Recommendations for follow up therapy are one component of a multi-disciplinary discharge planning process, led by the attending physician.  Recommendations may be updated based on patient status, additional functional criteria and insurance authorization.   Follow Up Recommendations  Home health OT    Assistance Recommended at Discharge PRN  Patient can return home with the following      Functional Status Assessment  Patient has had a recent decline in their functional status and demonstrates the ability to make significant improvements in function in a reasonable and predictable amount of time.  Equipment Recommendations  None recommended by OT    Recommendations for Other Services       Precautions / Restrictions Precautions Precautions: Back;Fall Precaution Booklet Issued: No Precaution Comments: Verbally reviewed back precautions.  Patient able to recall 3/3 back precuations with increased time. Requires cues for adherence during ADLs. Required Braces or Orthoses:  (No brace required) Restrictions Weight Bearing Restrictions: No      Mobility Bed Mobility               General bed mobility comments: Met ambulating in hallway with PT.    Transfers Overall transfer level: Needs assistance Equipment used: Rolling walker (2 wheels) Transfers: Sit to/from Stand Sit to Stand: Min guard           General transfer comment: Min guard for steadying. Cue for hand placement.      Balance Overall balance assessment: Mild deficits observed, not formally tested                                         ADL either performed or assessed with clinical judgement   ADL Overall ADL's : Needs assistance/impaired Eating/Feeding: Independent   Grooming: Supervision/safety;Standing Grooming Details (indicate cue type and reason): 3/3 grooming tasks standing at sink surface. Cues for adhernece to back precautions. Upper Body Bathing: Set up;Sitting   Lower Body Bathing: Min guard;Sit to/from stand   Upper Body Dressing : Set up;Sitting   Lower Body Dressing: Min guard;Sit to/from stand   Toilet Transfer: Agricultural engineer (2 wheels)   Toileting- Water quality scientist and Hygiene: Min guard Toileting - Clothing Manipulation Details (indicate cue type and reason): Voided bladder in standing with use of RW over standard height commode. Cues for technique.             Vision Baseline Vision/History: 1 Wears glasses  Ability to See in Adequate Light: 0 Adequate Patient Visual Report: No change from baseline Vision Assessment?: No apparent visual deficits     Perception     Praxis      Pertinent Vitals/Pain Pain Assessment: 0-10 Pain Score: 6  Pain Location: Low back (incisional) Pain Descriptors / Indicators: Aching;Sore;Grimacing Pain Intervention(s): Limited activity within  patient's tolerance;Monitored during session;Repositioned     Hand Dominance     Extremity/Trunk Assessment Upper Extremity Assessment Upper Extremity Assessment: Overall WFL for tasks assessed   Lower Extremity Assessment Lower Extremity Assessment: Defer to PT evaluation   Cervical / Trunk Assessment Cervical / Trunk Assessment: Back Surgery   Communication Communication Communication: No difficulties   Cognition Arousal/Alertness: Awake/alert Behavior During Therapy: WFL for tasks assessed/performed Overall Cognitive Status: Within Functional Limits for tasks assessed                                       General Comments       Exercises     Shoulder Instructions      Home Living Family/patient expects to be discharged to:: Private residence Living Arrangements: Alone Available Help at Discharge: Other (Comment) (None per patient) Type of Home: House Home Access: Level entry           Bathroom Shower/Tub: Walk-in shower     Bathroom Accessibility: Yes How Accessible: Accessible via walker Home Equipment: Rives (2 wheels);Shower seat - built in          Prior Functioning/Environment Prior Level of Function : Independent/Modified Independent;Driving                        OT Problem List: Decreased strength;Decreased activity tolerance;Impaired balance (sitting and/or standing);Decreased knowledge of use of DME or AE;Decreased safety awareness;Pain      OT Treatment/Interventions: Self-care/ADL training;Therapeutic exercise;Energy conservation;DME and/or AE instruction;Therapeutic activities;Patient/family education;Balance training    OT Goals(Current goals can be found in the care plan section) Acute Rehab OT Goals Patient Stated Goal: To return home. OT Goal Formulation: With patient Time For Goal Achievement: 05/06/21 Potential to Achieve Goals: Good ADL Goals Additional ADL Goal #1: Patient will complete ADLs  with Mod I and good adhernece to back precautions in prep for safe d/c home. Additional ADL Goal #2: Patient will recall 3 techniques to reduce risk of falls in prep for safe return home.  OT Frequency: Min 2X/week    Co-evaluation              AM-PAC OT "6 Clicks" Daily Activity     Outcome Measure Help from another person eating meals?: None Help from another person taking care of personal grooming?: A Little Help from another person toileting, which includes using toliet, bedpan, or urinal?: A Little Help from another person bathing (including washing, rinsing, drying)?: A Little Help from another person to put on and taking off regular upper body clothing?: A Little Help from another person to put on and taking off regular lower body clothing?: A Little 6 Click Score: 19   End of Session Equipment Utilized During Treatment: Rolling walker (2 wheels) Nurse Communication: Mobility status  Activity Tolerance: Patient tolerated treatment well Patient left: in chair;with call bell/phone within reach  OT Visit Diagnosis: Unsteadiness on feet (R26.81);Muscle weakness (generalized) (M62.81);Pain Pain - part of body:  (low back)  Time: 0447-1580 OT Time Calculation (min): 24 min Charges:  OT General Charges $OT Visit: 1 Visit OT Evaluation $OT Eval Low Complexity: 1 Low OT Treatments $Self Care/Home Management : 8-22 mins  Egidio Lofgren H. OTR/L Supplemental OT, Department of rehab services [phone]  Kayson Tasker R H. 04/22/2021, 8:56 AM

## 2021-04-22 NOTE — Care Management (Signed)
°  Transition of Care [facility]) Screening Note   Patient Details  Name: Bryan Kent Date of Birth: 1951-03-05   Transition of Care [facility]) CM/SW Contact:    Carles Collet, RN Phone Number: 04/22/2021, 1:47 PM    Transition of Care Department [facility]) has reviewed patient and we will continue to monitor patient advancement through interdisciplinary progression rounds. If new patient transition needs arise, please place a TOC consult.

## 2021-04-22 NOTE — Progress Notes (Signed)
Subjective: Patient reports that he is doing well and is pleased with his postoperative status thus far. He has no lower extremity pain. Appropriate incisional pain. No acute events overnight.   Objective: Vital signs in last 24 hours: Temp:  [97.8 F (36.6 C)-98.6 F (37 C)] 98 F (36.7 C) (01/04 0746) Pulse Rate:  [80-100] 88 (01/04 0746) Resp:  [15-21] 17 (01/04 0746) BP: (119-153)/(61-90) 119/72 (01/04 0746) SpO2:  [93 %-100 %] 93 % (01/04 0746) Weight:  [73.5 kg] 73.5 kg (01/03 1051)  Intake/Output from previous day: 01/03 0701 - 01/04 0700 In: 2719.9 [P.O.:420; I.V.:2000; IV Piggyback:299.9] Out: 1100 [Urine:700; Drains:100; Blood:300] Intake/Output this shift: No intake/output data recorded.  Physical Exam: Patient is awake, A/O X 4, conversant, and in good spirits. They are in NAD and VSS. Doing well. Speech is fluent and appropriate. MAEW with good strength . Sensation to light touch is intact. PERLA, EOMI. CNs grossly intact. Dressings are clean dry intact. Incisions are well approximated with no drainage, erythema, or edema. Hemovac with approximately 20 ml of drainage overnight. Hemovac has been removed.    Lab Results: No results for input(s): WBC, HGB, HCT, PLT in the last 72 hours. BMET No results for input(s): NA, K, CL, CO2, GLUCOSE, BUN, CREATININE, CALCIUM in the last 72 hours.  Studies/Results: DG Lumbar Spine 2-3 Views  Result Date: 04/21/2021 CLINICAL DATA:  Intraoperative, left L3-L4 fusion and revision of previous hardware EXAM: LUMBAR SPINE - 2-3 VIEW COMPARISON:  12/17/2019 radiographs FINDINGS: Four fluoroscopic images are obtained during the performance of the procedure and are provided for interpretation only. Images demonstrate previously noted posterior fusion hardware L4-S1, which is removed and replaced with new pedicle screws, extending the fusion now from L3-S1. No acute fracture. Fluoroscopy time: 1 minute 39 seconds 36.02 mGy IMPRESSION: Expected  intraoperative appearance of revision and extension of posterior fusion, now L3-S1. Electronically Signed   By: Merilyn Baba M.D.   On: 04/21/2021 18:44   DG C-Arm 1-60 Min-No Report  Result Date: 04/21/2021 Fluoroscopy was utilized by the requesting physician.  No radiographic interpretation.   DG C-Arm 1-60 Min-No Report  Result Date: 04/21/2021 Fluoroscopy was utilized by the requesting physician.  No radiographic interpretation.   DG C-Arm 1-60 Min-No Report  Result Date: 04/21/2021 Fluoroscopy was utilized by the requesting physician.  No radiographic interpretation.   DG C-Arm 1-60 Min-No Report  Result Date: 04/21/2021 Fluoroscopy was utilized by the requesting physician.  No radiographic interpretation.    Assessment/Plan: Patient is post-op day 1 s/p Left-sided TLIF L3/4 with revision of hardware and exploration of prior fusion at L4-S1. He is recovering well and reports a resolution of his preoperative lower extremity pain.  His only complaint is of appropriate incisional discomfort.  He is awaiting PT/OT evaluation. Continue working on pain control, mobility and ambulating patient. Will plan for discharge tomorrow.    LOS: 1 day     Marvis Moeller, DNP, NP-C 04/22/2021, 8:33 AM

## 2021-04-22 NOTE — Anesthesia Postprocedure Evaluation (Signed)
Anesthesia Post Note  Patient: Bryan Kent  Procedure(s) Performed: Minimally Invasive  Left, Lumbar three- lumbar four Transforaminal Lumbar Interbody Fusion With Revision of hardware, Exploration of fusion Lumbar four - lumbar five, Lumbar five- Sacral one (Left: Spine Lumbar)     Patient location during evaluation: PACU Anesthesia Type: General Level of consciousness: awake and alert Pain management: pain level controlled Vital Signs Assessment: post-procedure vital signs reviewed and stable Respiratory status: spontaneous breathing, nonlabored ventilation, respiratory function stable and patient connected to nasal cannula oxygen Cardiovascular status: blood pressure returned to baseline and stable Postop Assessment: no apparent nausea or vomiting Anesthetic complications: no   No notable events documented.  Last Vitals:  Vitals:   04/21/21 2040 04/21/21 2347  BP: 137/76 (!) 146/90  Pulse: 92 85  Resp: 18 18  Temp: 36.6 C 37 C  SpO2: 96% 99%    Last Pain:  Vitals:   04/22/21 0045  TempSrc:   PainSc: Asleep                 Tiajuana Amass

## 2021-04-23 LAB — GLUCOSE, CAPILLARY: Glucose-Capillary: 108 mg/dL — ABNORMAL HIGH (ref 70–99)

## 2021-04-23 MED ORDER — OXYCODONE-ACETAMINOPHEN 5-325 MG PO TABS: 1.0000 | ORAL_TABLET | ORAL | 0 refills | Status: DC | PRN

## 2021-04-23 MED ORDER — METHOCARBAMOL 750 MG PO TABS: 750.0000 mg | ORAL_TABLET | Freq: Four times a day (QID) | ORAL | 0 refills | Status: DC

## 2021-04-23 NOTE — Progress Notes (Signed)
Patient awaiting transport via wheelchair by volunteer for discharge home; in no acute distress nor complaints of pain nor discomfort; incision on his back x4 with honeycomb dressing and were clean, dry and intact; room was checked and accounted for all belongings; discharge instructions concerning his medications, incision care, follow up appointment and when to call the doctor as needed were all discussed with patient by RN and he verbalized understanding on the instructions given.

## 2021-04-23 NOTE — Progress Notes (Signed)
Occupational Therapy Treatment Patient Details Name: Bryan Kent MRN: [id number] DOB: 29-Jan-1951 Today's Date: 04/23/2021   History of present illness 71 y.o. male presenting for elective L3-4 TLIF and exploration of L4-5 and L5-S1 fusion. PMHx significant for arthritis, Hx of cancer (unspecified), COPD, CAD, DMII, HLD, HTN, PTSD and RA.   OT comments  OT treatment session with focus on self-care re-education, ADL transfers and re-education on back precautions. Patient initially unable to recall back precautions despite education yesterday. With re-education, patient able to recall 3/3 precautions. Patient continues to be limited by pain but does not require continued acute OT services with OT to sign off at this time. No follow-up OT recommended. Patient anticipating return home today.    Recommendations for follow up therapy are one component of a multi-disciplinary discharge planning process, led by the attending physician.  Recommendations may be updated based on patient status, additional functional criteria and insurance authorization.    Follow Up Recommendations  No OT follow up    Assistance Recommended at Discharge PRN  Patient can return home with the following      Equipment Recommendations  None recommended by OT    Recommendations for Other Services      Precautions / Restrictions Precautions Precautions: Back;Fall Precaution Booklet Issued: No Precaution Comments: Verbally reviewed back precautions. Patient able to recall 3/3 back precuations with increased time. Requires cues for adherence during ADLs. Required Braces or Orthoses:  (No brace required) Restrictions Weight Bearing Restrictions: No       Mobility Bed Mobility               General bed mobility comments: Met ambulating to bathroom with RN.    Transfers Overall transfer level: Needs assistance Equipment used: Rolling walker (2 wheels) Transfers: Sit to/from Stand Sit to Stand:  Supervision           General transfer comment: Supervision A for safety.     Balance Overall balance assessment: Mild deficits observed, not formally tested                                         ADL either performed or assessed with clinical judgement   ADL Overall ADL's : Needs assistance/impaired Eating/Feeding: Independent   Grooming: Supervision/safety;Standing Grooming Details (indicate cue type and reason): 2/3 grooming tasks standing at sink surface. Cues for adhernece to back precautions.                 Toilet Transfer: Supervision/safety;Rolling walker (2 wheels)   Toileting- Clothing Manipulation and Hygiene: Supervision/safety Toileting - Clothing Manipulation Details (indicate cue type and reason): Voided bladder in standing with use of RW over standard height commode. Cues for technique.            Extremity/Trunk Assessment              Vision       Perception     Praxis      Cognition Arousal/Alertness: Awake/alert Behavior During Therapy: WFL for tasks assessed/performed Overall Cognitive Status: Within Functional Limits for tasks assessed                                            Exercises     Shoulder Instructions  General Comments Clean/dry dressing at incision.    Pertinent Vitals/ Pain       Pain Assessment: 0-10 Pain Score: 5  Pain Location: Low back (incisional) Pain Descriptors / Indicators: Aching;Sore;Grimacing Pain Intervention(s): Limited activity within patient's tolerance;Monitored during session;Repositioned  Home Living                                          Prior Functioning/Environment              Frequency  Min 2X/week        Progress Toward Goals  OT Goals(current goals can now be found in the care plan section)  Progress towards OT goals: Progressing toward goals  Acute Rehab OT Goals Patient Stated Goal: To return  home. OT Goal Formulation: With patient Time For Goal Achievement: 05/06/21 Potential to Achieve Goals: Good ADL Goals Additional ADL Goal #1: Patient will complete ADLs with Mod I and good adhernece to back precautions in prep for safe d/c home. Additional ADL Goal #2: Patient will recall 3 techniques to reduce risk of falls in prep for safe return home.  Plan Discharge plan needs to be updated;Frequency remains appropriate    Co-evaluation                 AM-PAC OT "6 Clicks" Daily Activity     Outcome Measure   Help from another person eating meals?: None Help from another person taking care of personal grooming?: A Little Help from another person toileting, which includes using toliet, bedpan, or urinal?: A Little Help from another person bathing (including washing, rinsing, drying)?: A Little Help from another person to put on and taking off regular upper body clothing?: A Little Help from another person to put on and taking off regular lower body clothing?: A Little 6 Click Score: 19    End of Session Equipment Utilized During Treatment: Rolling walker (2 wheels)  OT Visit Diagnosis: Unsteadiness on feet (R26.81);Muscle weakness (generalized) (M62.81);Pain Pain - part of body:  (low back)   Activity Tolerance Patient tolerated treatment well   Patient Left in chair;with call bell/phone within reach   Nurse Communication Mobility status        Time: 0652-0702 OT Time Calculation (min): 10 min  Charges: OT General Charges $OT Visit: 1 Visit OT Treatments $Self Care/Home Management : 8-22 mins  Ronna Herskowitz H. OTR/L Supplemental OT, Department of rehab services [phone]  Melanie Pellot R H. 04/23/2021, 7:07 AM

## 2021-04-23 NOTE — Discharge Instructions (Signed)
Wound Care Leave incision open to air. You may shower. Do not scrub directly on incision.  Do not put any creams, lotions, or ointments on incision.  Activity Walk each and every day, increasing distance each day. No lifting greater than 5 lbs.  Avoid bending, arching, and twisting. No driving for 2 weeks; may ride as a passenger locally. If provided with back brace, wear when out of bed.  It is not necessary to wear in bed.  Diet Resume your normal diet.   Call Your Doctor If Any of These Occur Redness, drainage, or swelling at the wound.  Temperature greater than 101 degrees. Severe pain not relieved by pain medication. Incision starts to come apart.  Follow Up Appt Call today for appointment in 3-4 weeks ([phone]) or for problems.  If you have any hardware placed in your spine, you will need an x-ray before your appointment.

## 2021-04-23 NOTE — Discharge Summary (Signed)
Physician Discharge Summary  Patient ID: Bryan BOWSHER MRN: [id number] DOB/AGE: June 14, 1950 71 y.o.  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 The patient tolerated the procedure well and was taken to the recovery room and then to the floor in stable condition. The hospital course was routine. There were no complications. The wound remained clean dry and intact. Pt had appropriate back soreness. No complaints of leg pain or new N/T/W. The patient remained afebrile with stable vital signs, and tolerated a regular diet. The patient continued to increase activities, and pain was well controlled with oral pain medications. He is stable and appropriate for discharge.    Consults: None  Significant Diagnostic Studies: radiology: X-Ray: intraoperative   Treatments: surgery:  Minimally invasive L3-4 decompression and transforaminal lumbar interbody fusion and arthrodesis, left sided approach Exploration of fusion L4-5, L5-S1 with revision of hardware (removal of DePuy, replacement with NuVasive pedicle screw construct) Bilateral L3, L4, L5, S1 segmental pedicle screw instrumentation, NuVasive (L3 7.5 x 45 mm bilaterally, L4 7.5 x 50 mm on the left, 6.5 x 50 mm on the right, L5 6.5 x 50 mm on the right, S1 7.5 x 40 mm bilaterally) Placement of anterior biomechanical device, NuVasive titanium 7 x 10 x 25 mm interbody  device Intraoperative use of autograft, same incision Intraoperative use of allograft  Intraoperative use of BMP Intraoperative use of fluoroscopy, greater than 1 hour Intraoperative use of microscope, for microdissection    Discharge Exam: Blood pressure (!) 121/52, pulse 86, temperature 98.9 F (37.2 C), temperature source Oral, resp. rate 18, height 5\' 8"  (1.727 m), weight 73.5 kg, SpO2 94 %.  Physical Exam: Patient is awake, A/O X 4, conversant, and in good spirits. He is in NAD and VSS. Speech is fluent and appropriate. MAEW with good strength . Sensation to light touch is intact. PERLA, EOMI. CNs grossly intact. Dressings are clean dry intact. Incisions are well approximated with no drainage, erythema, or edema.   Disposition: Discharge disposition: 01-Home or Self Care       Discharge Instructions     Incentive spirometry RT   Complete by: As directed       Allergies as of 04/23/2021       Reactions   Lobster [shellfish Allergy] Hives   Benzalkonium Chloride Rash   Pravastatin Rash   Simvastatin Rash   Neosporin [bacitracin-polymyxin B]         Medication List     TAKE these medications    albuterol 0.63 MG/3ML nebulizer solution Commonly known as: ACCUNEB Take 1 ampule by nebulization in the morning and at bedtime.   ALPRAZolam 0.5 MG tablet Commonly known as: XANAX Take 0.5 mg by mouth 2 (two) times daily as needed for anxiety.   arformoterol 15 MCG/2ML Nebu Commonly known as: BROVANA Take 15 mcg by nebulization 2 (two) times daily.   aspirin EC 81 MG tablet Take 81 mg by mouth daily. Swallow whole.   azithromycin 250 MG tablet Commonly known as: ZITHROMAX Take 250  mg by mouth every Monday, Wednesday, and Friday.   budesonide-formoterol 80-4.5 MCG/ACT inhaler Commonly known as: SYMBICORT Inhale 1 puff into the lungs in the morning and at bedtime.   clopidogrel 75 MG tablet Commonly known as: PLAVIX Take 1 tablet (75 mg total) by mouth  daily.   empagliflozin 25 MG Tabs tablet Commonly known as: JARDIANCE Take 25 mg by mouth daily.   folic acid 1 MG tablet Commonly known as: FOLVITE Take 1 mg by mouth daily.   glipiZIDE 10 MG tablet Commonly known as: GLUCOTROL Take 10 mg by mouth daily with supper.   guaifenesin 400 MG Tabs tablet Commonly known as: HUMIBID E Take 400 mg by mouth every 6 (six) hours as needed (cough/congestion).   hydroxychloroquine 200 MG tablet Commonly known as: PLAQUENIL Take 200 mg by mouth daily.   Ipratropium-Albuterol 20-100 MCG/ACT Aers respimat Commonly known as: COMBIVENT Inhale 1 puff into the lungs every 6 (six) hours as needed for wheezing or shortness of breath.   metFORMIN 1000 MG tablet Commonly known as: GLUCOPHAGE Take 1,000 mg by mouth 2 (two) times daily with a meal.   methocarbamol 750 MG tablet Commonly known as: Robaxin-750 Take 1 tablet (750 mg total) by mouth 4 (four) times daily.   methotrexate 2.5 MG tablet Commonly known as: RHEUMATREX Take 20 mg by mouth every Saturday.   nitroGLYCERIN 0.4 MG SL tablet Commonly known as: NITROSTAT PLACE 1 TABLET (0.4 MG TOTAL) UNDER THE TONGUE EVERY FIVE MINUTES AS NEEDED FOR CHEST PAIN.   oxyCODONE-acetaminophen 5-325 MG tablet Commonly known as: Percocet Take 1-2 tablets by mouth every 4 (four) hours as needed for severe pain.   OZEMPIC (1 MG/DOSE) [place] Inject 1 mg into the skin once a week. Saturday   pantoprazole 40 MG tablet Commonly known as: PROTONIX TAKE 1 TABLET (40 MG TOTAL) BY MOUTH DAILY.   pioglitazone 30 MG tablet Commonly known as: ACTOS Take 30 mg by mouth daily.   predniSONE 5 MG tablet Commonly known as: DELTASONE Take 5 mg by mouth daily as needed (rheumatoid arthritis).   propranolol 10 MG tablet Commonly known as: INDERAL Take 10 mg by mouth 2 (two) times daily as needed (anxiety/PTSD).   rosuvastatin 5 MG tablet Commonly known as: Crestor Take 1 tablet (5 mg total) by mouth daily.          Signed: Marvis Moeller, DNP, NP-C 04/23/2021, 8:12 AM

## 2021-04-23 NOTE — Plan of Care (Signed)

## 2021-07-20 ENCOUNTER — Ambulatory Visit: Payer: No Typology Code available for payment source | Admitting: Cardiovascular Disease

## 2021-10-21 ENCOUNTER — Other Ambulatory Visit: Payer: Self-pay | Admitting: Neurosurgery

## 2021-10-21 ENCOUNTER — Other Ambulatory Visit (HOSPITAL_COMMUNITY): Payer: Self-pay | Admitting: Neurosurgery

## 2021-10-21 DIAGNOSIS — M4316 Spondylolisthesis, lumbar region: Secondary | ICD-10-CM

## 2021-10-27 ENCOUNTER — Emergency Department (HOSPITAL_COMMUNITY): Payer: No Typology Code available for payment source

## 2021-10-27 ENCOUNTER — Encounter (HOSPITAL_COMMUNITY): Payer: Self-pay | Admitting: Emergency Medicine

## 2021-10-27 ENCOUNTER — Other Ambulatory Visit: Payer: Self-pay

## 2021-10-27 ENCOUNTER — Emergency Department (HOSPITAL_COMMUNITY)
Admission: EM | Admit: 2021-10-27 | Discharge: 2021-10-27 | Disposition: A | Discharge status: Home / Self Care | Payer: No Typology Code available for payment source | Attending: Emergency Medicine | Admitting: Emergency Medicine

## 2021-10-27 DIAGNOSIS — Z794 Long term (current) use of insulin: Secondary | ICD-10-CM | POA: Diagnosis not present

## 2021-10-27 DIAGNOSIS — Z7982 Long term (current) use of aspirin: Secondary | ICD-10-CM | POA: Diagnosis not present

## 2021-10-27 DIAGNOSIS — M545 Low back pain, unspecified: Secondary | ICD-10-CM | POA: Diagnosis present

## 2021-10-27 DIAGNOSIS — W19XXXA Unspecified fall, initial encounter: Secondary | ICD-10-CM | POA: Diagnosis not present

## 2021-10-27 LAB — URINALYSIS, ROUTINE W REFLEX MICROSCOPIC
Bilirubin Urine: NEGATIVE
Glucose, UA: >=500 mg/dL — AB
Hgb urine dipstick: NEGATIVE
Ketones, ur: 20 mg/dL — AB
Leukocytes,Ua: NEGATIVE
Nitrite: NEGATIVE
Protein, ur: NEGATIVE mg/dL
Specific Gravity, Urine: 1.031 — ABNORMAL HIGH (ref 1.005–1.030)
pH: 5 (ref 5.0–8.0)

## 2021-10-27 MED ORDER — OXYCODONE-ACETAMINOPHEN 5-325 MG PO TABS: 1.0000 | ORAL_TABLET | ORAL | 0 refills | Status: DC | PRN

## 2021-10-27 MED ORDER — HYDROMORPHONE HCL 1 MG/ML IJ SOLN
1.0000 mg | Freq: Once | INTRAMUSCULAR | Status: AC
  Administered 2021-10-27: 1 mg via INTRAVENOUS
  Filled 2021-10-27: qty 1

## 2021-10-27 MED ORDER — PREDNISONE 20 MG PO TABS: 40.0000 mg | ORAL_TABLET | Freq: Every day | ORAL | 0 refills | Status: DC

## 2021-10-27 NOTE — ED Triage Notes (Signed)
Back pain x 1 week had back surgery in January but pain in same area as surgery. Went to see doctor last week. And had xray last week that was negative. MRI scheduled for next week. Pain is getting worse and feels like legs are weak. 2 days ago left knee buckled and fell to the ground and left lower back is hurting worse now.

## 2021-10-27 NOTE — Discharge Instructions (Signed)
Take the prednisone as directed.  You may try applying over-the-counter 4% lidocaine patches to the affected area.  You may purchase these without a prescription.  Please follow-up with your neurosurgeon next week.  Return to the emergency department for any new or worsening symptoms.

## 2021-10-27 NOTE — ED Provider Notes (Signed)
[facility] EMERGENCY DEPARTMENT Provider Note   CSN: [id number] Arrival date & time: 10/27/21  1033     History  Chief Complaint  Patient presents with   Back Pain    Bryan Kent is a 71 y.o. male.   Back Pain Associated symptoms: no abdominal pain, no chest pain, no fever, no headaches, no numbness and no weakness         Bryan Kent is a 71 y.o. male who presents to the Emergency Department complaining of worsening left-sided low back pain x1 week.  He reports revision of prior lumbar fusion in January of this year.  1 week ago, he developed sharp stabbing type pain of his left lower back that radiates into his hip and buttock.  Pain occasionally to the left uppermost thigh.  Had x-ray of his lower back without acute findings.  Scheduled to see his neurosurgeon next week.  2 days ago, he had severe pain while ambulating to the restroom and states his left knee buckled causing him to fall.  He denies any abdominal pain, numbness of his lower extremities, urine or bowel changes, fever or chills.  Pain worsens when sitting, minimally improves while at rest   Home Medications Prior to Admission medications   Medication Sig Start Date End Date Taking? Authorizing Provider  oxyCODONE-acetaminophen (PERCOCET/ROXICET) 5-325 MG tablet Take 1 tablet by mouth every 4 (four) hours as needed. 10/27/21  Yes Javyon Fontan, PA-C  predniSONE (DELTASONE) 20 MG tablet Take 2 tablets (40 mg total) by mouth daily. 10/27/21  Yes Thomas Rhude, PA-C  albuterol (ACCUNEB) 0.63 MG/3ML nebulizer solution Take 1 ampule by nebulization in the morning and at bedtime.    [provider]  ALPRAZolam Duanne Moron) 0.5 MG tablet Take 0.5 mg by mouth 2 (two) times daily as needed for anxiety.    [provider]  arformoterol (BROVANA) 15 MCG/2ML NEBU Take 15 mcg by nebulization 2 (two) times daily.    [provider]  aspirin EC 81 MG tablet Take 81 mg by mouth daily. Swallow  whole.    [provider]  azithromycin (ZITHROMAX) 250 MG tablet Take 250 mg by mouth every Monday, Wednesday, and Friday.    [provider]  budesonide-formoterol (SYMBICORT) 80-4.5 MCG/ACT inhaler Inhale 1 puff into the lungs in the morning and at bedtime.    [provider]  clopidogrel (PLAVIX) 75 MG tablet Take 1 tablet (75 mg total) by mouth daily. 06/20/20   Sande Rives E, PA-C  empagliflozin (JARDIANCE) 25 MG TABS tablet Take 25 mg by mouth daily.    [provider]  folic acid (FOLVITE) 1 MG tablet Take 1 mg by mouth daily.  07/07/16   [provider]  glipiZIDE (GLUCOTROL) 10 MG tablet Take 10 mg by mouth daily with supper.    [provider]  guaifenesin (HUMIBID E) 400 MG TABS tablet Take 400 mg by mouth every 6 (six) hours as needed (cough/congestion).    [provider]  hydroxychloroquine (PLAQUENIL) 200 MG tablet Take 200 mg by mouth daily. 07/07/16   [provider]  Ipratropium-Albuterol (COMBIVENT) 20-100 MCG/ACT AERS respimat Inhale 1 puff into the lungs every 6 (six) hours as needed for wheezing or shortness of breath. 03/03/21   Chesley Mires, MD  metFORMIN (GLUCOPHAGE) 1000 MG tablet Take 1,000 mg by mouth 2 (two) times daily with a meal.    [provider]  methocarbamol (ROBAXIN-750) 750 MG tablet Take 1 tablet (750 mg total)  by mouth 4 (four) times daily. 04/23/21   Marvis Moeller, NP  methotrexate (RHEUMATREX) 2.5 MG tablet Take 20 mg by mouth every Saturday. 01/05/17   [provider]  nitroGLYCERIN (NITROSTAT) 0.4 MG SL tablet PLACE 1 TABLET (0.4 MG TOTAL) UNDER THE TONGUE EVERY FIVE MINUTES AS NEEDED FOR CHEST PAIN. 06/20/20 06/20/21  Sande Rives E, PA-C  pantoprazole (PROTONIX) 40 MG tablet TAKE 1 TABLET (40 MG TOTAL) BY MOUTH DAILY. 06/20/20 06/20/21  Sande Rives E, PA-C  pioglitazone (ACTOS) 30 MG tablet Take 30 mg by mouth daily.     [provider]  propranolol  (INDERAL) 10 MG tablet Take 10 mg by mouth 2 (two) times daily as needed (anxiety/PTSD).    [provider]  rosuvastatin (CRESTOR) 5 MG tablet Take 1 tablet (5 mg total) by mouth daily. 06/20/20 04/21/21  Sande Rives E, PA-C  Semaglutide (OZEMPIC, 1 MG/DOSE, ) Inject 1 mg into the skin once a week. Saturday    [provider]      Allergies    Lobster [shellfish allergy], Benzalkonium chloride, Pravastatin, Simvastatin, and Neosporin [bacitracin-polymyxin b]    Review of Systems   Review of Systems  Constitutional:  Negative for chills and fever.  Respiratory:  Negative for cough.   Cardiovascular:  Negative for chest pain.  Gastrointestinal:  Negative for abdominal pain, nausea and vomiting.  Genitourinary:  Negative for difficulty urinating and flank pain.  Musculoskeletal:  Positive for back pain. Negative for neck pain.  Skin:  Negative for color change and wound.  Neurological:  Negative for dizziness, weakness, numbness and headaches.    Physical Exam Updated Vital Signs BP 117/81 (BP Location: Right Arm)   Pulse (!) 115   Temp 98.1 F (36.7 C) (Oral)   Resp 18   Ht '5\' 8"'$  (1.727 m)   Wt 70.8 kg   SpO2 98%   BMI 23.72 kg/m  Physical Exam Vitals and nursing note reviewed.  Constitutional:      General: He is not in acute distress.    Appearance: Normal appearance. He is not toxic-appearing.  Cardiovascular:     Rate and Rhythm: Normal rate and regular rhythm.     Pulses: Normal pulses.  Pulmonary:     Effort: Pulmonary effort is normal. No respiratory distress.  Abdominal:     Palpations: Abdomen is soft.     Tenderness: There is no abdominal tenderness.  Musculoskeletal:        General: Tenderness present. No swelling.     Right lower leg: No edema.     Left lower leg: No edema.     Comments: Tender to palpation of the mid to lower lumbar spine and left lumbar paraspinal muscles.  No pain with range of motion of the hip.  Skin:    General:  Skin is warm.     Capillary Refill: Capillary refill takes less than 2 seconds.  Neurological:     General: No focal deficit present.     Mental Status: He is alert.     Sensory: No sensory deficit.     Motor: No weakness.     ED Results / Procedures / Treatments   Labs (all labs ordered are listed, but only abnormal results are displayed) Labs Reviewed  URINALYSIS, ROUTINE W REFLEX MICROSCOPIC - Abnormal; Notable for the following components:      Result Value   Specific Gravity, Urine 1.031 (*)    Glucose, UA >=500 (*)    Ketones, ur  20 (*)    Bacteria, UA RARE (*)    All other components within normal limits    EKG None  Radiology MR LUMBAR SPINE WO CONTRAST  Result Date: 10/27/2021 CLINICAL DATA:  Low back pain, prior surgery, new symptoms EXAM: MRI LUMBAR SPINE WITHOUT CONTRAST TECHNIQUE: Multiplanar, multisequence MR imaging of the lumbar spine was performed. No intravenous contrast was administered. COMPARISON:  October 2022 FINDINGS: Segmentation:  Standard. Alignment:  Stable anterolisthesis at L5-S1. Vertebrae: Postoperative changes are identified at L3-S1 with rods and screws and interbody spacers. Hardware is not evaluated and there is associated susceptibility artifact. Vertebral body heights are unchanged. There is no substantial marrow edema. No suspicious osseous lesion. Conus medullaris and cauda equina: Conus extends to the T12-L1 level. Conus and cauda equina appear normal. Paraspinal and other soft tissues: Chronic postoperative changes. 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 Disc levels: L1-L2:  No canal or foraminal stenosis. L2-L3: Disc bulge. Mild facet arthropathy. Prominent dorsal epidural fat. Increased mild canal stenosis. Slight effacement of subarticular recesses, left greater than right. No foraminal stenosis. L3-L4: Interval operative level. Persistent left ventral epidural tissue dorsal to inferior L3 may reflect residual herniated disc  material (series 5, image 10). No canal or right foraminal stenosis. Left foramen is distorted by artifact with significantly reduced stenosis. L4-L5:  Operative level.  No stenosis. L5-S1: Operative level. Anterolisthesis. No canal stenosis. Minor foraminal narrowing. IMPRESSION: Interval extension of fusion now spanning L3-S1. Improved stenosis at L3-L4. Persistent small left ventral epidural tissue dorsal to L3 may reflect residual herniated disc fragment. Increased mild canal stenosis at L2-L3. Electronically Signed   By: Macy Mis M.D.   On: 10/27/2021 14:08    Procedures Procedures    Medications Ordered in ED Medications  HYDROmorphone (DILAUDID) injection 1 mg (1 mg Intravenous Given 10/27/21 1303)    ED Course/ Medical Decision Making/ A&P                           Medical Decision Making Patient here with left-sided low back pain x1 week.  Worse after fall that occurred yesterday.  History includes revision of prior lumbar fusion.  On exam, patient has focal tenderness to the mid to lower lumbar spine and left paraspinal muscles.  No focal neurodeficits.  No red flags to suggest cauda equina.  No recent procedures to indicate spinal abscess.  Since patient does have prior spine surgery with new focal findings, will proceed with MRI.  Amount and/or Complexity of Data Reviewed Labs: ordered.    Details: Urinalysis without evidence of infection Radiology: ordered.    Details: MRI L-spine shows extension of fusion that spans from L3-S1 persistent small left ventral epidural tissue to L3 that likely represents residual herniated disc fragment. Discussion of management or test interpretation with external provider(s): Discussed findings with the patient.  Pain improved after IV medication.  Remains neurovascularly intact.  Ambulatory with slow but steady gait.  He has upcoming appointment next week with his neurosurgeon.  Will provide short course of pain medication and steroid.  He  appears appropriate for discharge home, all questions were answered.  Risk Prescription drug management.           Final Clinical Impression(s) / ED Diagnoses Final diagnoses:  Acute left-sided low back pain without sciatica    Rx / DC Orders ED Discharge Orders          Ordered    predniSONE (DELTASONE)  20 MG tablet  Daily        10/27/21 1441    oxyCODONE-acetaminophen (PERCOCET/ROXICET) 5-325 MG tablet  Every 4 hours PRN        10/27/21 1441              Kem Parkinson, PA-C 10/27/21 1459    Fredia Sorrow, MD 10/30/21 1600

## 2021-11-02 ENCOUNTER — Other Ambulatory Visit: Payer: Self-pay | Admitting: Neurosurgery

## 2021-11-02 DIAGNOSIS — M5416 Radiculopathy, lumbar region: Secondary | ICD-10-CM

## 2021-11-04 ENCOUNTER — Ambulatory Visit (HOSPITAL_COMMUNITY): Admission: RE | Admit: 2021-11-04 | Payer: No Typology Code available for payment source | Source: Ambulatory Visit

## 2021-11-04 ENCOUNTER — Encounter (HOSPITAL_COMMUNITY): Payer: Self-pay

## 2021-11-06 ENCOUNTER — Ambulatory Visit
Admission: RE | Admit: 2021-11-06 | Discharge: 2021-11-06 | Disposition: A | Discharge status: Home / Self Care | Payer: No Typology Code available for payment source | Source: Ambulatory Visit | Attending: Neurosurgery | Admitting: Neurosurgery

## 2021-11-06 ENCOUNTER — Ambulatory Visit (HOSPITAL_COMMUNITY): Payer: No Typology Code available for payment source

## 2021-11-06 DIAGNOSIS — M5416 Radiculopathy, lumbar region: Secondary | ICD-10-CM

## 2021-11-06 MED ORDER — METHYLPREDNISOLONE ACETATE 40 MG/ML INJ SUSP (RADIOLOG
80.0000 mg | Freq: Once | INTRAMUSCULAR | Status: AC
  Administered 2021-11-06: 80 mg via EPIDURAL

## 2021-11-06 MED ORDER — IOPAMIDOL (ISOVUE-M 200) INJECTION 41%
1.0000 mL | Freq: Once | INTRAMUSCULAR | Status: AC
  Administered 2021-11-06: 1 mL via EPIDURAL

## 2021-11-06 NOTE — Discharge Instructions (Signed)

## 2022-02-07 ENCOUNTER — Emergency Department (HOSPITAL_COMMUNITY)
Admission: EM | Admit: 2022-02-07 | Discharge: 2022-02-07 | Disposition: A | Discharge status: Home / Self Care | Payer: No Typology Code available for payment source | Attending: Emergency Medicine | Admitting: Emergency Medicine

## 2022-02-07 ENCOUNTER — Other Ambulatory Visit: Payer: Self-pay

## 2022-02-07 ENCOUNTER — Emergency Department (HOSPITAL_COMMUNITY): Payer: No Typology Code available for payment source

## 2022-02-07 ENCOUNTER — Encounter (HOSPITAL_COMMUNITY): Payer: Self-pay | Admitting: *Deleted

## 2022-02-07 DIAGNOSIS — M1990 Unspecified osteoarthritis, unspecified site: Secondary | ICD-10-CM

## 2022-02-07 DIAGNOSIS — Z7984 Long term (current) use of oral hypoglycemic drugs: Secondary | ICD-10-CM | POA: Diagnosis not present

## 2022-02-07 DIAGNOSIS — I2510 Atherosclerotic heart disease of native coronary artery without angina pectoris: Secondary | ICD-10-CM | POA: Insufficient documentation

## 2022-02-07 DIAGNOSIS — Z87891 Personal history of nicotine dependence: Secondary | ICD-10-CM | POA: Insufficient documentation

## 2022-02-07 DIAGNOSIS — Z7951 Long term (current) use of inhaled steroids: Secondary | ICD-10-CM | POA: Diagnosis not present

## 2022-02-07 DIAGNOSIS — E119 Type 2 diabetes mellitus without complications: Secondary | ICD-10-CM | POA: Diagnosis not present

## 2022-02-07 DIAGNOSIS — Z7901 Long term (current) use of anticoagulants: Secondary | ICD-10-CM | POA: Insufficient documentation

## 2022-02-07 DIAGNOSIS — M79642 Pain in left hand: Secondary | ICD-10-CM | POA: Diagnosis present

## 2022-02-07 DIAGNOSIS — I10 Essential (primary) hypertension: Secondary | ICD-10-CM | POA: Diagnosis not present

## 2022-02-07 DIAGNOSIS — Z7982 Long term (current) use of aspirin: Secondary | ICD-10-CM | POA: Insufficient documentation

## 2022-02-07 DIAGNOSIS — Z7952 Long term (current) use of systemic steroids: Secondary | ICD-10-CM | POA: Insufficient documentation

## 2022-02-07 DIAGNOSIS — J449 Chronic obstructive pulmonary disease, unspecified: Secondary | ICD-10-CM | POA: Insufficient documentation

## 2022-02-07 DIAGNOSIS — Z79899 Other long term (current) drug therapy: Secondary | ICD-10-CM | POA: Diagnosis not present

## 2022-02-07 DIAGNOSIS — M19042 Primary osteoarthritis, left hand: Secondary | ICD-10-CM | POA: Diagnosis not present

## 2022-02-07 MED ORDER — OXYCODONE-ACETAMINOPHEN 5-325 MG PO TABS: 1.0000 | ORAL_TABLET | Freq: Four times a day (QID) | ORAL | 0 refills | Status: DC | PRN

## 2022-02-07 MED ORDER — PREDNISONE 50 MG PO TABS
60.0000 mg | ORAL_TABLET | Freq: Once | ORAL | Status: AC
  Administered 2022-02-07: 60 mg via ORAL
  Filled 2022-02-07: qty 1

## 2022-02-07 MED ORDER — PREDNISONE 10 MG PO TABS: 40.0000 mg | ORAL_TABLET | Freq: Every day | ORAL | 0 refills | Status: DC

## 2022-02-07 NOTE — ED Provider Notes (Signed)
[facility] EMERGENCY DEPARTMENT Provider Note   CSN: [id number] Arrival date & time: 02/07/22  0930     History  Chief Complaint  Patient presents with   Hand Pain    Bryan Kent is a 71 y.o. male.  Patient followed by the [facility].  Patient with left hand pain mostly around the ulnar side since yesterday morning.  Suddenly began to hurt.  Swelling increased warmth and pain.  May be some numbness.  Patient known to have significant arthritis in his hands.  Past medical history significant for the arthritis diabetes hyperlipidemia COPD posttraumatic stress disorder hypertension coronary artery disease.  Patient had coronary stents placed in 2022.  Patient's former smoker but did smoke for 35 years.       Home Medications Prior to Admission medications   Medication Sig Start Date End Date Taking? Authorizing Provider  oxyCODONE-acetaminophen (PERCOCET/ROXICET) 5-325 MG tablet Take 1 tablet by mouth every 6 (six) hours as needed for severe pain. 02/07/22  Yes Fredia Sorrow, MD  predniSONE (DELTASONE) 10 MG tablet Take 4 tablets (40 mg total) by mouth daily. 02/07/22  Yes Fredia Sorrow, MD  albuterol (ACCUNEB) 0.63 MG/3ML nebulizer solution Take 1 ampule by nebulization in the morning and at bedtime.    [provider]  ALPRAZolam Duanne Moron) 0.5 MG tablet Take 0.5 mg by mouth 2 (two) times daily as needed for anxiety.    [provider]  arformoterol (BROVANA) 15 MCG/2ML NEBU Take 15 mcg by nebulization 2 (two) times daily.    [provider]  aspirin EC 81 MG tablet Take 81 mg by mouth daily. Swallow whole.    [provider]  azithromycin (ZITHROMAX) 250 MG tablet Take 250 mg by mouth every Monday, Wednesday, and Friday.    [provider]  budesonide-formoterol (SYMBICORT) 80-4.5 MCG/ACT inhaler Inhale 1 puff into the lungs in the morning and at bedtime.    [provider]  clopidogrel (PLAVIX) 75 MG tablet Take 1  tablet (75 mg total) by mouth daily. 06/20/20   Sande Rives E, PA-C  empagliflozin (JARDIANCE) 25 MG TABS tablet Take 25 mg by mouth daily.    [provider]  folic acid (FOLVITE) 1 MG tablet Take 1 mg by mouth daily.  07/07/16   [provider]  glipiZIDE (GLUCOTROL) 10 MG tablet Take 10 mg by mouth daily with supper.    [provider]  guaifenesin (HUMIBID E) 400 MG TABS tablet Take 400 mg by mouth every 6 (six) hours as needed (cough/congestion).    [provider]  hydroxychloroquine (PLAQUENIL) 200 MG tablet Take 200 mg by mouth daily. 07/07/16   [provider]  Ipratropium-Albuterol (COMBIVENT) 20-100 MCG/ACT AERS respimat Inhale 1 puff into the lungs every 6 (six) hours as needed for wheezing or shortness of breath. 03/03/21   Chesley Mires, MD  metFORMIN (GLUCOPHAGE) 1000 MG tablet Take 1,000 mg by mouth 2 (two) times daily with a meal.    [provider]  methocarbamol (ROBAXIN-750) 750 MG tablet Take 1 tablet (750 mg total) by mouth 4 (four) times daily. 04/23/21   Marvis Moeller, NP  methotrexate (RHEUMATREX) 2.5 MG tablet Take 20 mg by mouth every Saturday. 01/05/17   [provider]  nitroGLYCERIN (NITROSTAT) 0.4 MG SL tablet PLACE 1 TABLET (0.4 MG TOTAL) UNDER THE TONGUE EVERY FIVE MINUTES AS NEEDED FOR CHEST PAIN. 06/20/20 06/20/21  Sande Rives E, PA-C  oxyCODONE-acetaminophen (PERCOCET/ROXICET) 5-325 MG tablet Take 1 tablet by mouth  every 4 (four) hours as needed. 10/27/21   Triplett, Tammy, PA-C  pantoprazole (PROTONIX) 40 MG tablet TAKE 1 TABLET (40 MG TOTAL) BY MOUTH DAILY. 06/20/20 06/20/21  Sande Rives E, PA-C  pioglitazone (ACTOS) 30 MG tablet Take 30 mg by mouth daily.     [provider]  predniSONE (DELTASONE) 20 MG tablet Take 2 tablets (40 mg total) by mouth daily. 10/27/21   Triplett, Tammy, PA-C  propranolol (INDERAL) 10 MG tablet Take 10 mg by mouth 2 (two) times daily as needed (anxiety/PTSD).     [provider]  rosuvastatin (CRESTOR) 5 MG tablet Take 1 tablet (5 mg total) by mouth daily. 06/20/20 04/21/21  Sande Rives E, PA-C  Semaglutide (OZEMPIC, 1 MG/DOSE, [place]) Inject 1 mg into the skin once a week. Saturday    [provider]      Allergies    Lobster [shellfish allergy], Benzalkonium chloride, Pravastatin, Simvastatin, and Neosporin [bacitracin-polymyxin b]    Review of Systems   Review of Systems  Constitutional:  Negative for chills and fever.  HENT:  Negative for ear pain and sore throat.   Eyes:  Negative for pain and visual disturbance.  Respiratory:  Negative for cough and shortness of breath.   Cardiovascular:  Negative for chest pain and palpitations.  Gastrointestinal:  Negative for abdominal pain and vomiting.  Genitourinary:  Negative for dysuria and hematuria.  Musculoskeletal:  Positive for arthralgias and joint swelling. Negative for back pain.  Skin:  Negative for color change and rash.  Neurological:  Negative for seizures and syncope.  All other systems reviewed and are negative.   Physical Exam Updated Vital Signs BP 133/74   Pulse 80   Temp 98.3 F (36.8 C) (Oral)   Resp 16   Ht 1.727 m ('5\' 8"'$ )   Wt 70.3 kg   SpO2 93%   BMI 23.57 kg/m  Physical Exam Vitals and nursing note reviewed.  Constitutional:      General: He is not in acute distress.    Appearance: Normal appearance. He is well-developed.  HENT:     Head: Normocephalic and atraumatic.  Eyes:     Conjunctiva/sclera: Conjunctivae normal.     Pupils: Pupils are equal, round, and reactive to light.  Cardiovascular:     Rate and Rhythm: Normal rate and regular rhythm.     Heart sounds: No murmur heard. Pulmonary:     Effort: Pulmonary effort is normal. No respiratory distress.     Breath sounds: Normal breath sounds.  Abdominal:     Palpations: Abdomen is soft.     Tenderness: There is no abdominal tenderness.  Musculoskeletal:        General: Swelling,  tenderness and deformity present.     Cervical back: Neck supple.     Comments: Left hand with a lot of swelling and increased warmth on the ulnar side of the palm.  A lot of arthritic changes to the patient's joints in the fingers and hand.  Good cap refill to the fingers.  No proximal forearm swelling at all.  Just swelling to the left hand.  Skin:    General: Skin is warm and dry.     Capillary Refill: Capillary refill takes less than 2 seconds.  Neurological:     General: No focal deficit present.     Mental Status: He is alert and oriented to person, place, and time.     Cranial Nerves: No cranial nerve deficit.     Sensory: No  sensory deficit.     Motor: No weakness.  Psychiatric:        Mood and Affect: Mood normal.     ED Results / Procedures / Treatments   Labs (all labs ordered are listed, but only abnormal results are displayed) Labs Reviewed - No data to display  EKG None  Radiology DG Hand Complete Left  Result Date: 02/07/2022 CLINICAL DATA:  Left arm numbness.  Finger pain and swelling EXAM: LEFT WRIST - COMPLETE 3+ VIEW; LEFT HAND - COMPLETE 3+ VIEW COMPARISON:  None Available. FINDINGS: No acute fracture or dislocation. Moderate to severe osteoarthritic changes are most pronounced at the first [facility] joint and distal interphalangeal joints of the index and small fingers. No erosion. Chondrocalcinosis of the TFCC. No focal soft tissue swelling. 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 No acute fracture or dislocation. Electronically Signed   By: Davina Poke D.O.   On: 02/07/2022 12:37   DG Wrist Complete Left  Result Date: 02/07/2022 CLINICAL DATA:  Left arm numbness.  Finger pain and swelling EXAM: LEFT WRIST - COMPLETE 3+ VIEW; LEFT HAND - COMPLETE 3+ VIEW COMPARISON:  None Available. FINDINGS: No acute fracture or dislocation. Moderate to severe osteoarthritic changes are most pronounced at the first [facility] joint and  distal interphalangeal joints of the index and small fingers. No erosion. Chondrocalcinosis of the TFCC. No focal soft tissue swelling. 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 No acute fracture or dislocation. Electronically Signed   By: Davina Poke D.O.   On: 02/07/2022 12:37    Procedures Procedures    Medications Ordered in ED Medications  predniSONE (DELTASONE) tablet 60 mg (has no administration in time range)    ED Course/ Medical Decision Making/ A&P                           Medical Decision Making Amount and/or Complexity of Data Reviewed Radiology: ordered.   Suspect inflammatory arthritis.  Certainly based on his history of arthritis.  He is got x-ray results from the [place] that showed significant changes in the metacarpal area.  And carpal joints.  Will get x-rays.  If no evidence of fracture we will treat with a course of steroids and pain medicine.  X-ray shows no bony abnormalities lots of arthritis.  We will treat with 60 mg of prednisone here have him continue with 40 mg of prednisone for the next 5 days and also Percocet for pain.  [facility]   Final Clinical Impression(s) / ED Diagnoses Final diagnoses:  Left hand pain  Inflammatory arthritis    Rx / DC Orders ED Discharge Orders          Ordered    predniSONE (DELTASONE) 10 MG tablet  Daily        02/07/22 1318    oxyCODONE-acetaminophen (PERCOCET/ROXICET) 5-325 MG tablet  Every 6 hours PRN        02/07/22 1318              Fredia Sorrow, MD 02/07/22 1319

## 2022-02-07 NOTE — Discharge Instructions (Signed)
Take the prednisone starting tomorrow 40 mg a day for the next 5 days.  Take Percocet as needed for pain.  Make an appointment to follow-up with the [facility].  X-ray without any fractures.  All seems to be consistent with inflammatory arthritis.

## 2022-02-07 NOTE — ED Triage Notes (Addendum)
Pt c/o left arm numbness and pain along with left hand swelling since he woke up yesterday morning. LKW 02/05/22 when he went to bed. Pt reports the numbness, pain and swelling has gotten worse since it started. Denies chest pain or SOB.

## 2022-11-10 ENCOUNTER — Encounter: Payer: Self-pay | Admitting: Allergy & Immunology

## 2022-11-10 ENCOUNTER — Ambulatory Visit (INDEPENDENT_AMBULATORY_CARE_PROVIDER_SITE_OTHER): Payer: No Typology Code available for payment source | Admitting: Allergy & Immunology

## 2022-11-10 VITALS — BP 112/64 | HR 97 | Temp 98.6°F | Resp 18 | Ht 66.54 in | Wt 167.4 lb

## 2022-11-10 DIAGNOSIS — J310 Chronic rhinitis: Secondary | ICD-10-CM | POA: Diagnosis not present

## 2022-11-10 DIAGNOSIS — T7800XD Anaphylactic reaction due to unspecified food, subsequent encounter: Secondary | ICD-10-CM | POA: Diagnosis not present

## 2022-11-10 DIAGNOSIS — D849 Immunodeficiency, unspecified: Secondary | ICD-10-CM | POA: Diagnosis not present

## 2022-11-10 DIAGNOSIS — J449 Chronic obstructive pulmonary disease, unspecified: Secondary | ICD-10-CM | POA: Diagnosis not present

## 2022-11-10 NOTE — Addendum Note (Signed)
Addended by: Orson Aloe on: 11/10/2022 05:54 PM   Modules accepted: Orders

## 2022-11-10 NOTE — Patient Instructions (Addendum)
1. Chronic obstructive pulmonary disease - Lung testing was in the 50% range and did not change with the albuterol treatment.  - We will just continue with the same regimen from Dr. Shirlee Limerick at the [facility].  - We could consider Dupixent as an additive treatment for your breathing.  - Information on Dupixent provided.  - [facility], although it might take some work on our part to get it done. - But we just want to have the option, which is why we are getting that complete blood count.   2. Chronic rhinitis - Testing today showed: NEGATIVE TO THE ENTIRE PANEL  - Copy of test results provided.  - We are going to confirm with blood work.  - Start taking: Zyrtec (cetirizine) 10mg  tablet once daily (this could replace the Benadryl) - You can use an extra dose of the antihistamine, if needed, for breakthrough symptoms.  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 - Copy of testing provided. - We are going to confirm with blood work.   4. Return in about 6 months (around 05/13/2023). You can have the follow up appointment with Dr. Dellis Anes or a Nurse Practicioner (our Nurse Practitioners are excellent and always have Physician oversight!).    Please inform us of any Emergency Department visits, hospitalizations, or changes in symptoms. Call us before going to the ED for breathing or allergy symptoms since we might be able to fit you in for a sick visit. Feel free to contact us anytime with any questions, problems, or concerns.  It was a pleasure to meet you today!  Websites that have reliable patient information: 1. American Academy of Asthma, Allergy, and Immunology: www.aaaai.org 2. Food Allergy Research and Education (FARE): foodallergy.org 3. Mothers of Asthmatics:  http://www.asthmacommunitynetwork.org 4. American College of Allergy, Asthma, and Immunology: www.acaai.org   COVID-19 Vaccine Information can be found at: [email or web address] For questions related to vaccine distribution or appointments, please email [email or web address] or call [phone].   We realize that you might be concerned about having an allergic reaction to the COVID19 vaccines. To help with that concern, WE ARE OFFERING THE COVID19 VACCINES IN OUR OFFICE! Ask the front desk for dates!     "Like" [place] on Facebook and Instagram for our latest updates!      A healthy democracy works best when [employer] participate! Make sure you are registered to vote! If you have moved or changed any of your contact information, you will need to get this updated before voting!  In some cases, you MAY be able to register to vote online: [email or web address]     Airborne Adult Perc - 11/10/22 1009     Time Antigen Placed 1009    Allergen Manufacturer Waynette Buttery    Location Back    Number of Test 55    1. Control-Buffer 50% Glycerol Negative    2. Control-Histamine 2+    3. Bahia Negative    4. [place] Negative    5. Johnson Negative    6. Kentucky Blue Negative    7. Meadow Fescue Negative    8. Perennial Rye Negative    9. Timothy Negative    10. Ragweed Mix Negative    11. Cocklebur Negative    12. Plantain,  English Negative  13. Baccharis Negative    14. Dog Fennel Negative    15. Russian Thistle Negative    16. Lamb's Quarters Negative    17. Sheep Sorrell Negative    18. Rough Pigweed Negative    19. Marsh Elder, Rough Negative    20. Mugwort, Common Negative    21. Box, Elder Negative    22. Cedar, red Negative    23. Sweet Gum Negative    24. Pecan Pollen Negative    25. Pine Mix Negative    26. Walnut, Black Pollen Negative    27. Red Mulberry Negative    28. Ash Mix Negative     29. Birch Mix Negative    30. Beech American Negative    31. Cottonwood, [place] Negative    32. Hickory, White Negative    33. Maple Mix Negative    34. Oak, [place] Mix Negative    35. Sycamore Eastern Negative    36. Alternaria Alternata Negative    37. Cladosporium Herbarum Negative    38. Aspergillus Mix Negative    39. Penicillium Mix Negative    40. Bipolaris Sorokiniana (Helminthosporium) Negative    41. Drechslera Spicifera (Curvularia) Negative    42. Mucor Plumbeus Negative    43. Fusarium Moniliforme Negative    44. Aureobasidium Pullulans (pullulara) Negative    45. Rhizopus Oryzae Negative    46. Botrytis Cinera Negative    47. Epicoccum Nigrum Negative    48. Phoma Betae Negative    49. Dust Mite Mix Negative    50. Cat Hair 10,000 BAU/ml Negative    51.  Dog Epithelia Negative    52. Mixed Feathers Negative    53. Horse Epithelia Negative    54. Cockroach, German Negative    55. Tobacco Leaf Negative             Food Adult Perc - 11/10/22 1000     Time Antigen Placed 1009    Allergen Manufacturer Waynette Buttery    Location Back    Number of allergen test 6     Control-buffer 50% Glycerol Negative    Control-Histamine 2+    8. Shellfish Mix Negative    23. Shrimp Negative    24. Crab Negative    25. Lobster Negative    26. Oyster Negative    27. Scallops Negative
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
## 2022-11-11 LAB — CBC WITH DIFFERENTIAL
Basophils Absolute: 0.1 10*3/uL (ref 0.0–0.2)
Basos: 1 %
EOS (ABSOLUTE): 0.3 10*3/uL (ref 0.0–0.4)
Eos: 2 %
Hemoglobin: 13.9 g/dL (ref 13.0–17.7)
Immature Grans (Abs): 0.1 10*3/uL (ref 0.0–0.1)
Immature Granulocytes: 1 %
Lymphocytes Absolute: 1.7 10*3/uL (ref 0.7–3.1)
Lymphs: 15 %
MCH: 32.4 pg (ref 26.6–33.0)
MCHC: 32.6 g/dL (ref 31.5–35.7)
MCV: 100 fL — ABNORMAL HIGH (ref 79–97)
Monocytes Absolute: 1.2 10*3/uL — ABNORMAL HIGH (ref 0.1–0.9)
Monocytes: 11 %
Neutrophils Absolute: 8.1 10*3/uL — ABNORMAL HIGH (ref 1.4–7.0)
Neutrophils: 70 %
RBC: 4.29 x10E6/uL (ref 4.14–5.80)
RDW: 13.1 % (ref 11.6–15.4)
WBC: 11.5 10*3/uL — ABNORMAL HIGH (ref 3.4–10.8)

## 2023-01-06 ENCOUNTER — Encounter: Payer: Self-pay | Admitting: Internal Medicine

## 2023-01-06 ENCOUNTER — Ambulatory Visit: Payer: No Typology Code available for payment source | Admitting: Internal Medicine

## 2023-01-06 VITALS — BP 137/79 | HR 86 | Temp 99.0°F | Ht 68.0 in | Wt 174.2 lb

## 2023-01-06 DIAGNOSIS — D001 Carcinoma in situ of esophagus: Secondary | ICD-10-CM | POA: Diagnosis not present

## 2023-01-06 DIAGNOSIS — Z1211 Encounter for screening for malignant neoplasm of colon: Secondary | ICD-10-CM | POA: Diagnosis not present

## 2023-01-06 DIAGNOSIS — K219 Gastro-esophageal reflux disease without esophagitis: Secondary | ICD-10-CM | POA: Diagnosis not present

## 2023-01-06 NOTE — Progress Notes (Signed)
Primary Care Physician:  Hulan Saas, FNP Primary Gastroenterologist:  Dr. Marletta Lor  Chief Complaint  Patient presents with   New Patient (Initial Visit)    Pt referred for EGD    HPI:   Bryan Kent is a 72 y.o. male who presents to clinic today by referral from the [place] for evaluation.  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  Last EGD 2017 short segment Barrett's esophagus GE junction patulous deformed pylorus from past gastric ulcer perforation.  Supposed to be taking pantoprazole daily though states he does not take this.  Was recommended that he undergo surveillance EGD prior though he has been hesitant.  Denies any acid reflux or heartburn.  No dysphagia odynophagia.  No epigastric or chest pain.  As far as colon cancer screening, denies ever having a colonoscopy.  He is not interested in having a colonoscopy.  States his sister had colon cancer.  He does state he did stool cards yearly which have been negative.  Past Medical History:  Diagnosis Date   Arthritis    Cancer (HCC)    COPD (chronic obstructive pulmonary disease) (HCC)    Coronary artery disease    Diabetes mellitus without complication (HCC)    Difficult intubation    Glidescope used [facility] 12/02/16, grade 3 view   Hyperlipidemia    Hypertension    PTSD (post-traumatic stress disorder)    Rheumatoid arthritis (HCC)     Past Surgical History:  Procedure Laterality Date   BACK SURGERY     fusion   CORONARY ATHERECTOMY N/A 06/19/2020   Procedure: CORONARY ATHERECTOMY;  Surgeon: Runell Gess, MD;  Location: [facility];  Service: Cardiovascular;  Laterality: N/A;   CORONARY IMAGING/OCT N/A 06/19/2020   Procedure: INTRAVASCULAR IMAGING/OCT;  Surgeon: Runell Gess, MD;  Location: [facility];  Service: Cardiovascular;  Laterality: N/A;   CORONARY PRESSURE/FFR WITH 3D MAPPING N/A 06/19/2020   Procedure: Coronary  Pressure Wire/FFR w/3D Mapping;  Surgeon: Runell Gess, MD;  Location: [facility];  Service: Cardiovascular;  Laterality: N/A;   CORONARY STENT INTERVENTION  06/19/2020   CORONARY STENT INTERVENTION N/A 06/19/2020   Procedure: CORONARY STENT INTERVENTION;  Surgeon: Runell Gess, MD;  Location: [facility];  Service: Cardiovascular;  Laterality: N/A;   gastric ulcer     KNEE SURGERY     LEFT HEART CATH AND CORONARY ANGIOGRAPHY N/A 06/19/2020   Procedure: LEFT HEART CATH AND CORONARY ANGIOGRAPHY;  Surgeon: Runell Gess, MD;  Location: [facility];  Service: Cardiovascular;  Laterality: N/A;   REPAIR OF PERFORATED ULCER     ROTATOR CUFF REPAIR Right    TRANSFORAMINAL LUMBAR INTERBODY FUSION W/ MIS 1 LEVEL Left 04/21/2021   Procedure: Minimally Invasive  Left, Lumbar three- lumbar four Transforaminal Lumbar Interbody Fusion With Revision of hardware, Exploration of fusion Lumbar four - lumbar five, Lumbar five- Sacral one;  Surgeon: Dawley, Alan Mulder, DO;  Location: [facility];  Service: Neurosurgery;  Laterality: Left;    Current Outpatient Medications  Medication Sig Dispense Refill   albuterol (ACCUNEB) 0.63 MG/3ML nebulizer solution Take 1 ampule by nebulization in the morning and at bedtime.     ALPRAZolam (XANAX) 0.5 MG tablet Take 0.5 mg by mouth 2 (two) times daily as needed for anxiety.     arformoterol (BROVANA) 15 MCG/2ML NEBU Take 15 mcg by nebulization 2 (two) times daily.  aspirin EC 81 MG tablet Take 81 mg by mouth daily. Swallow whole.     azithromycin (ZITHROMAX) 250 MG tablet Take 250 mg by mouth every Monday, Wednesday, and Friday.     budesonide-formoterol (SYMBICORT) 80-4.5 MCG/ACT inhaler Inhale 1 puff into the lungs in the morning and at bedtime.     clopidogrel (PLAVIX) 75 MG tablet Take 1 tablet (75 mg total) by mouth daily. 30 tablet 5   empagliflozin (JARDIANCE) 25 MG TABS tablet Take 25 mg by mouth daily.     folic acid (FOLVITE) 1 MG  tablet Take 1 mg by mouth daily.      glipiZIDE (GLUCOTROL) 10 MG tablet Take 10 mg by mouth daily with supper.     guaifenesin (HUMIBID E) 400 MG TABS tablet Take 400 mg by mouth every 6 (six) hours as needed (cough/congestion).     hydroxychloroquine (PLAQUENIL) 200 MG tablet Take 200 mg by mouth daily.     Ipratropium-Albuterol (COMBIVENT) 20-100 MCG/ACT AERS respimat Inhale 1 puff into the lungs every 6 (six) hours as needed for wheezing or shortness of breath. 4 g 5   metFORMIN (GLUCOPHAGE) 1000 MG tablet Take 1,000 mg by mouth 2 (two) times daily with a meal.     methocarbamol (ROBAXIN-750) 750 MG tablet Take 1 tablet (750 mg total) by mouth 4 (four) times daily. 60 tablet 0   methotrexate (RHEUMATREX) 2.5 MG tablet Take 20 mg by mouth every Saturday.     oxyCODONE-acetaminophen (PERCOCET/ROXICET) 5-325 MG tablet Take 1 tablet by mouth every 4 (four) hours as needed. 12 tablet 0   oxyCODONE-acetaminophen (PERCOCET/ROXICET) 5-325 MG tablet Take 1 tablet by mouth every 6 (six) hours as needed for severe pain. 12 tablet 0   pioglitazone (ACTOS) 30 MG tablet Take 30 mg by mouth daily.      predniSONE (DELTASONE) 5 MG tablet Take 5 mg by mouth daily with breakfast.     propranolol (INDERAL) 10 MG tablet Take 10 mg by mouth 2 (two) times daily as needed (anxiety/PTSD).     Semaglutide (OZEMPIC, 1 MG/DOSE, [place]) Inject 1 mg into the skin once a week. Saturday     nitroGLYCERIN (NITROSTAT) 0.4 MG SL tablet PLACE 1 TABLET (0.4 MG TOTAL) UNDER THE TONGUE EVERY FIVE MINUTES AS NEEDED FOR CHEST PAIN. 25 tablet 0   pantoprazole (PROTONIX) 40 MG tablet TAKE 1 TABLET (40 MG TOTAL) BY MOUTH DAILY. 30 tablet 0   rosuvastatin (CRESTOR) 5 MG tablet Take 1 tablet (5 mg total) by mouth daily. 30 tablet 0   No current facility-administered medications for this visit.    Allergies as of 01/06/2023 - Review Complete 01/06/2023  Allergen Reaction Noted   Lobster [shellfish allergy] Hives 08/18/2017   Benzalkonium  chloride Rash 07/07/2016   Pravastatin Rash 11/24/2016   Simvastatin Rash 11/24/2016   Neosporin [bacitracin-polymyxin b]  12/07/2018    Family History  Problem Relation Age of Onset   Heart disease Father    Asthma Mother     Social History   Socioeconomic History   Marital status: Divorced    Spouse name: Not on file   Number of children: Not on file   Years of education: Not on file   Highest education level: Not on file  Occupational History   Occupation: Retired - Armed forces operational officer   Occupation: [place] Veteran  Tobacco Use   Smoking status: Former    Current packs/day: 1.00    Average packs/day: 1 pack/day for 35.0 years (35.0 ttl pk-yrs)  Types: Cigarettes   Smokeless tobacco: Never  Vaping Use   Vaping status: Never Used  Substance and Sexual Activity   Alcohol use: Yes    Comment: very rarely   Drug use: Never   Sexual activity: Not on file  Other Topics Concern   Not on file  Social History Narrative   Not on file   Social Determinants of Health   Financial Resource Strain: Not on file  Food Insecurity: Not on file  Transportation Needs: Not on file  Physical Activity: Not on file  Stress: Not on file  Social Connections: Unknown (08/31/2021)   Received from [facility], [facility]   Social Network    Social Network: Not on file  Intimate Partner Violence: Unknown (07/23/2021)   Received from [facility], [facility]   HITS    Physically Hurt: Not on file    Insult or Talk Down To: Not on file    Threaten Physical Harm: Not on file    Scream or Curse: Not on file    Subjective: Review of Systems  Constitutional:  Negative for chills and fever.  HENT:  Negative for congestion and hearing loss.   Eyes:  Negative for blurred vision and double vision.  Respiratory:  Negative for cough and shortness of breath.   Cardiovascular:  Negative for chest pain and palpitations.  Gastrointestinal:  Negative for abdominal pain, blood in stool,  constipation, diarrhea, heartburn, melena and vomiting.  Genitourinary:  Negative for dysuria and urgency.  Musculoskeletal:  Negative for joint pain and myalgias.  Skin:  Negative for itching and rash.  Neurological:  Negative for dizziness and headaches.  Psychiatric/Behavioral:  Negative for depression. The patient is not nervous/anxious.        Objective: BP 137/79   Pulse 86   Temp 99 F (37.2 C)   Ht 5\' 8"  (1.727 m)   Wt 174 lb 3.2 oz (79 kg)   BMI 26.49 kg/m  Physical Exam Constitutional:      Appearance: Normal appearance.  HENT:     Head: Normocephalic and atraumatic.  Eyes:     Extraocular Movements: Extraocular movements intact.     Conjunctiva/sclera: Conjunctivae normal.  Cardiovascular:     Rate and Rhythm: Normal rate and regular rhythm.  Pulmonary:     Effort: Pulmonary effort is normal.     Breath sounds: Normal breath sounds.  Abdominal:     General: Bowel sounds are normal.     Palpations: Abdomen is soft.  Musculoskeletal:        General: Normal range of motion.     Cervical back: Normal range of motion and neck supple.  Skin:    General: Skin is warm.  Neurological:     General: No focal deficit present.     Mental Status: He is alert and oriented to person, place, and time.  Psychiatric:        Mood and Affect: Mood normal.        Behavior: Behavior normal.      Assessment: *Barrett's esophagus with adenocarcinoma in situ s/p EMR 2013 *Peptic ulcer disease with history of perforated ulcer status post surgical fixation *Family history of colon cancer in sister  Plan: Will schedule for surveillance EGD today in regards to his Barrett's esophagus with previous adenocarcinoma in situ.   The risks including infection, bleed, or perforation as well as benefits, limitations, alternatives and imponderables have been reviewed with the patient. Potential for esophageal dilation, biopsy, etc. have also  been reviewed.  Questions have been answered.  All parties agreeable.  Recommended that he take a daily PPI such as pantoprazole to help prevent recurrence/progression of his Barrett's esophagus.  Discussed colon cancer screening with colonoscopy today given his family history.  He is not interested in this.  Will continue to do stool cards, may change his mind if this ever becomes positive.   01/06/2023 11:03 AM   Disclaimer: This note was dictated with voice recognition software. Similar sounding words can inadvertently be transcribed and may not be corrected upon review.

## 2023-01-06 NOTE — Patient Instructions (Signed)
We will schedule you for upper endoscopy for surveillance purposes in regards to your history of Barrett's esophagus and esophageal cancer.  It was very nice meeting you today.  Thank you for your service to our country.  Dr. Marletta Lor

## 2023-01-07 ENCOUNTER — Telehealth: Payer: Self-pay | Admitting: *Deleted

## 2023-01-07 NOTE — Telephone Encounter (Signed)
Called to schedule pt for EGD. Gave 2 dates 10/25 & 10/28 and he states that he can't do those dates. Advised him that we would be getting some dates soon for November. He states that would be better for him. Will call pt back once we get providers dates.

## 2023-01-17 ENCOUNTER — Encounter: Payer: Self-pay | Admitting: *Deleted

## 2023-01-17 NOTE — Telephone Encounter (Signed)
Pt has been scheduled for 03/14/23. Instructions mailed.

## 2023-01-18 ENCOUNTER — Encounter: Payer: Self-pay | Admitting: *Deleted

## 2023-03-09 NOTE — Patient Instructions (Signed)
Bryan Kent  03/09/2023     @PREFPERIOPPHARMACY @   Your procedure is scheduled on 03/14/2023.  Report to [facility] at 6:30 [facility].M.  Call this number if you have problems the morning of surgery:  [phone]  If you experience any cold or flu symptoms such as cough, fever, chills, shortness of breath, etc. between now and your scheduled surgery, please notify us at the above number.   Remember:   Please Follow the diet instructions given to you by Dr [facility] office.     You may drink clear liquids until 3:30am .  Clear liquids allowed are:                    Water, Carbonated beverages (diabetics please choose diet or no sugar options), Clear Tea (No creamer, milk, or cream, including half & half and powdered creamer), Black Coffee Only (No creamer, milk or cream, including half & half and powdered creamer), and Clear Sports drink (No red color; diabetics please choose diet or no sugar options)    Take these medicines the morning of surgery with A SIP OF WATER : Xanax (if needed)  Plaquenil Protonix Propranolol Robaxin and Oxycodone (if needed).   Last dose of Ozempic should be on 03/06/2023 or before.   Last dose of Jardiance should be on 03/10/2023.   Please use your Albuterol Inhaler before coming to the hospital.     Do not wear jewelry, make-up or nail polish, including gel polish,  artificial nails, or any other type of covering on natural nails (fingers and  toes).  Do not wear lotions, powders, or perfumes, or deodorant.  Do not shave 48 hours prior to surgery.  Men may shave face and neck.  Do not bring valuables to the hospital.  [facility] is not responsible for any belongings or valuables.  Contacts, dentures or bridgework may not be worn into surgery.  Leave your suitcase in the car.  After surgery it may be brought to your room.  For patients admitted to the hospital, discharge time will be determined by your treatment team.  Patients discharged the day  of surgery will not be allowed to drive home.   Name and phone number of your driver:   Family Special instructions:  N/a  Please read over the following fact sheets that you were given. Care and Recovery After Surgery  Upper Endoscopy, Adult Upper endoscopy is a procedure to look inside the upper GI (gastrointestinal) tract. The upper GI tract is made up of: The esophagus. This is the part of the body that moves food from your mouth to your stomach. The stomach. The duodenum. This is the first part of your small intestine. This procedure is also called esophagogastroduodenoscopy (EGD) or gastroscopy. In this procedure, your health care provider passes a thin, flexible tube (endoscope) through your mouth and down your esophagus into your stomach and into your duodenum. A small camera is attached to the end of the tube. Images from the camera appear on a monitor in the exam room. During this procedure, your health care provider may also remove a small piece of tissue to be sent to a lab and examined under a microscope (biopsy). Your health care provider may do an upper endoscopy to diagnose cancers of the upper GI tract. You may also have this procedure to find the cause of other conditions, such as: Stomach pain. Heartburn. Pain or problems when swallowing. Nausea and vomiting. Stomach bleeding. Stomach ulcers.  Tell a health care provider about: Any allergies you have. All medicines you are taking, including vitamins, herbs, eye drops, creams, and over-the-counter medicines. Any problems you or family members have had with anesthetic medicines. Any bleeding problems you have. Any surgeries you have had. Any medical conditions you have. Whether you are pregnant or may be pregnant. What are the risks? Your healthcare provider will talk with you about risks. These may include: Infection. Bleeding. Allergic reactions to medicines. A tear or hole (perforation) in the esophagus, stomach,  or duodenum. What happens before the procedure? When to stop eating and drinking Follow instructions from your health care provider about what you may eat and drink. These may include: 8 hours before your procedure Stop eating most foods. Do not eat meat, fried foods, or fatty foods. Eat only light foods, such as toast or crackers. All liquids are okay except energy drinks and alcohol. 6 hours before your procedure Stop eating. Drink only clear liquids, such as water, clear fruit juice, black coffee, plain tea, and sports drinks. Do not drink energy drinks or alcohol. 2 hours before your procedure Stop drinking all liquids. You may be allowed to take medicines with small sips of water. If you do not follow your health care provider's instructions, your procedure may be delayed or canceled. Medicines Ask your health care provider about: Changing or stopping your regular medicines. This is especially important if you are taking diabetes medicines or blood thinners. Taking medicines such as aspirin and ibuprofen. These medicines can thin your blood. Do not take these medicines unless your health care provider tells you to take them. Taking over-the-counter medicines, vitamins, herbs, and supplements. General instructions If you will be going home right after the procedure, plan to have a responsible adult: Take you home from the hospital or clinic. You will not be allowed to drive. Care for you for the time you are told. What happens during the procedure?  An IV will be inserted into one of your veins. You may be given one or more of the following: A medicine to help you relax (sedative). A medicine to numb the throat (local anesthetic). You will lie on your left side on an exam table. Your health care provider will pass the endoscope through your mouth and down your esophagus. Your health care provider will use the scope to check the inside of your esophagus, stomach, and duodenum.  Biopsies may be taken. The endoscope will be removed. The procedure may vary among health care providers and hospitals. What happens after the procedure? Your blood pressure, heart rate, breathing rate, and blood oxygen level will be monitored until you leave the hospital or clinic. When your throat is no longer numb, you may be given some fluids to drink. If you were given a sedative during the procedure, it can affect you for several hours. Do not drive or operate machinery until your health care provider says that it is safe. It is up to you to get the results of your procedure. Ask your health care provider, or the department that is doing the procedure, when your results will be ready. Contact a health care provider if you: Have a sore throat that lasts longer than 1 day. Have a fever. Get help right away if you: Vomit blood or your vomit looks like coffee grounds. Have bloody, black, or tarry stools. Have a very bad sore throat or you cannot swallow. Have difficulty breathing or very bad pain in your chest or abdomen.  These symptoms may be an emergency. Get help right away. Call 911. Do not wait to see if the symptoms will go away. Do not drive yourself to the hospital. Summary Upper endoscopy is a procedure to look inside the upper GI tract. During the procedure, an IV will be inserted into one of your veins. You may be given a medicine to help you relax. The endoscope will be passed through your mouth and down your esophagus. Follow instructions from your health care provider about what you can eat and drink. This information is not intended to replace advice given to you by your health care provider. Make sure you discuss any questions you have with your health care provider. Document Revised: 07/15/2021 Document Reviewed: 07/15/2021 Elsevier Patient Education  2024 Else[employer] Inc[employer]  Monitored Anesthesia Care Anesthesia refers to the techniques, procedures, and medicines that help  a person stay safe and comfortable during surgery. Monitored anesthesia care, or sedation, is one type of anesthesia. You may have sedation if you do not need to be asleep for your procedure. Procedures that use sedation may include: Surgery to remove cataracts from your eyes. A dental procedure. A biopsy. This is when a tissue sample is removed and looked at under a microscope. You will be watched closely during your procedure. Your level of sedation or type of anesthesia may be changed to fit your needs. Tell a health care provider about: Any allergies you have. All medicines you are taking, including vitamins, herbs, eye drops, creams, and over-the-counter medicines. Any problems you or family members have had with anesthesia. Any bleeding problems you have. Any surgeries you have had. Any medical conditions or illnesses you have. This includes sleep apnea, cough, fever, or the flu. Whether you are pregnant or may be pregnant. Whether you use cigarettes, alcohol, or drugs. Any use of steroids, whether by mouth or as a cream. What are the risks? Your health care provider will talk with you about risks. These may include: Getting too much medicine (oversedation). Nausea. Allergic reactions to medicines. Trouble breathing. If this happens, a breathing tube may be used to help you breathe. It will be removed when you are awake and breathing on your own. Heart trouble. Lung trouble. Confusion that gets better with time (emergence delirium). What happens before the procedure? When to stop eating and drinking Follow instructions from your health care provider about what you may eat and drink. These may include: 8 hours before your procedure Stop eating most foods. Do not eat meat, fried foods, or fatty foods. Eat only light foods, such as toast or crackers. All liquids are okay except energy drinks and alcohol. 6 hours before your procedure Stop eating. Drink only clear liquids, such as  water, clear fruit juice, black coffee, plain tea, and sports drinks. Do not drink energy drinks or alcohol. 2 hours before your procedure Stop drinking all liquids. You may be allowed to take medicines with small sips of water. If you do not follow your health care provider's instructions, your procedure may be delayed or canceled. Medicines Ask your health care provider about: Changing or stopping your regular medicines. These include any diabetes medicines or blood thinners you take. Taking medicines such as aspirin and ibuprofen. These medicines can thin your blood. Do not take them unless your health care provider tells you to. Taking over-the-counter medicines, vitamins, herbs, and supplements. Testing You may have an exam or testing. You may have a blood or urine sample taken. General instructions Do not  use any products that contain nicotine or tobacco for at least 4 weeks before the procedure. These products include cigarettes, chewing tobacco, and vaping devices, such as e-cigarettes. If you need help quitting, ask your health care provider. If you will be going home right after the procedure, plan to have a responsible adult: Take you home from the hospital or clinic. You will not be allowed to drive. Care for you for the time you are told. What happens during the procedure?  Your blood pressure, heart rate, breathing, level of pain, and blood oxygen level will be monitored. An IV will be inserted into one of your veins. You may be given: A sedative. This helps you relax. Anesthesia. This will: Numb certain areas of your body. Make you fall asleep for surgery. You will be given medicines as needed to keep you comfortable. The more medicine you are given, the deeper your level of sedation will be. Your level of sedation may be changed to fit your needs. There are three levels of sedation: Mild sedation. At this level, you may feel awake and relaxed. You will be able to follow  directions. Moderate sedation. At this level, you will be sleepy. You may not remember the procedure. Deep sedation. At this level, you will be asleep. You will not remember the procedure. How you get the medicines will depend on your age and the procedure. They may be given as: A pill. This may be taken by mouth (orally) or inserted into the rectum. An injection. This may be into a vein or muscle. A spray through the nose. After your procedure is over, the medicine will be stopped. The procedure may vary among health care providers and hospitals. What happens after the procedure? Your blood pressure, heart rate, breathing rate, and blood oxygen level will be monitored until you leave the hospital or clinic. You may feel sleepy, clumsy, or nauseous. You may not remember what happened during or after the procedure. Sedation can affect you for several hours. Do not drive or use machinery until your health care provider says that it is safe. This information is not intended to replace advice given to you by your health care provider. Make sure you discuss any questions you have with your health care provider. Document Revised: 08/31/2021 Document Reviewed: 08/31/2021 Elsevier Patient Education  2024 [employer].

## 2023-03-10 ENCOUNTER — Encounter (HOSPITAL_COMMUNITY)
Admission: RE | Admit: 2023-03-10 | Discharge: 2023-03-10 | Disposition: A | Discharge status: Home / Self Care | Payer: No Typology Code available for payment source | Source: Ambulatory Visit | Attending: Internal Medicine | Admitting: Internal Medicine

## 2023-03-10 VITALS — BP 120/82 | HR 92 | Temp 97.7°F | Resp 18 | Ht 68.0 in | Wt 174.2 lb

## 2023-03-10 DIAGNOSIS — I10 Essential (primary) hypertension: Secondary | ICD-10-CM | POA: Insufficient documentation

## 2023-03-10 DIAGNOSIS — Z01818 Encounter for other preprocedural examination: Secondary | ICD-10-CM | POA: Insufficient documentation

## 2023-03-10 DIAGNOSIS — E118 Type 2 diabetes mellitus with unspecified complications: Secondary | ICD-10-CM | POA: Insufficient documentation

## 2023-03-10 LAB — BASIC METABOLIC PANEL
Anion gap: 12 (ref 5–15)
BUN: 24 mg/dL — ABNORMAL HIGH (ref 8–23)
CO2: 25 mmol/L (ref 22–32)
Calcium: 8.8 mg/dL — ABNORMAL LOW (ref 8.9–10.3)
Chloride: 99 mmol/L (ref 98–111)
Creatinine, Ser: 0.72 mg/dL (ref 0.61–1.24)
GFR, Estimated: >60 mL/min (ref >60)
Glucose, Bld: 247 mg/dL — ABNORMAL HIGH (ref 70–99)
Potassium: 4 mmol/L (ref 3.5–5.1)
Sodium: 136 mmol/L (ref 135–145)

## 2023-03-14 ENCOUNTER — Ambulatory Visit (HOSPITAL_COMMUNITY): Payer: No Typology Code available for payment source | Admitting: Anesthesiology

## 2023-03-14 ENCOUNTER — Encounter (HOSPITAL_COMMUNITY)
Admission: RE | Disposition: A | Discharge status: Home / Self Care | Payer: Self-pay | Source: Home / Self Care | Attending: Internal Medicine

## 2023-03-14 ENCOUNTER — Encounter (HOSPITAL_COMMUNITY): Payer: Self-pay

## 2023-03-14 ENCOUNTER — Ambulatory Visit (HOSPITAL_COMMUNITY)
Admission: RE | Admit: 2023-03-14 | Discharge: 2023-03-14 | Disposition: A | Discharge status: Home / Self Care | Payer: No Typology Code available for payment source | Attending: Internal Medicine | Admitting: Internal Medicine

## 2023-03-14 DIAGNOSIS — Z955 Presence of coronary angioplasty implant and graft: Secondary | ICD-10-CM | POA: Insufficient documentation

## 2023-03-14 DIAGNOSIS — Z7985 Long-term (current) use of injectable non-insulin antidiabetic drugs: Secondary | ICD-10-CM | POA: Insufficient documentation

## 2023-03-14 DIAGNOSIS — K3189 Other diseases of stomach and duodenum: Secondary | ICD-10-CM | POA: Insufficient documentation

## 2023-03-14 DIAGNOSIS — Z7951 Long term (current) use of inhaled steroids: Secondary | ICD-10-CM | POA: Insufficient documentation

## 2023-03-14 DIAGNOSIS — K2270 Barrett's esophagus without dysplasia: Secondary | ICD-10-CM | POA: Diagnosis not present

## 2023-03-14 DIAGNOSIS — J449 Chronic obstructive pulmonary disease, unspecified: Secondary | ICD-10-CM | POA: Insufficient documentation

## 2023-03-14 DIAGNOSIS — Z7984 Long term (current) use of oral hypoglycemic drugs: Secondary | ICD-10-CM | POA: Insufficient documentation

## 2023-03-14 DIAGNOSIS — Z87891 Personal history of nicotine dependence: Secondary | ICD-10-CM | POA: Diagnosis not present

## 2023-03-14 DIAGNOSIS — I2510 Atherosclerotic heart disease of native coronary artery without angina pectoris: Secondary | ICD-10-CM | POA: Diagnosis not present

## 2023-03-14 DIAGNOSIS — K219 Gastro-esophageal reflux disease without esophagitis: Secondary | ICD-10-CM | POA: Diagnosis not present

## 2023-03-14 DIAGNOSIS — E119 Type 2 diabetes mellitus without complications: Secondary | ICD-10-CM | POA: Insufficient documentation

## 2023-03-14 DIAGNOSIS — I10 Essential (primary) hypertension: Secondary | ICD-10-CM | POA: Insufficient documentation

## 2023-03-14 HISTORY — PX: ESOPHAGOGASTRODUODENOSCOPY (EGD) WITH PROPOFOL: SHX5813

## 2023-03-14 HISTORY — PX: BIOPSY: SHX5522

## 2023-03-14 LAB — GLUCOSE, CAPILLARY: Glucose-Capillary: 206 mg/dL — ABNORMAL HIGH (ref 70–99)

## 2023-03-14 SURGERY — ESOPHAGOGASTRODUODENOSCOPY (EGD) WITH PROPOFOL
Anesthesia: General

## 2023-03-14 MED ORDER — MIDAZOLAM HCL 2 MG/2ML IJ SOLN
INTRAMUSCULAR | Status: DC | PRN
  Administered 2023-03-14: 2 mg via INTRAVENOUS

## 2023-03-14 MED ORDER — LIDOCAINE HCL (CARDIAC) PF 100 MG/5ML IV SOSY
PREFILLED_SYRINGE | INTRAVENOUS | Status: DC | PRN
  Administered 2023-03-14: 50 mg via INTRAVENOUS

## 2023-03-14 MED ORDER — PROPOFOL 10 MG/ML IV BOLUS
INTRAVENOUS | Status: DC | PRN
  Administered 2023-03-14 (×3): 50 mg via INTRAVENOUS

## 2023-03-14 MED ORDER — PROPOFOL 1000 MG/100ML IV EMUL
INTRAVENOUS | Status: AC
  Filled 2023-03-14: qty 100

## 2023-03-14 MED ORDER — MIDAZOLAM HCL 2 MG/2ML IJ SOLN
INTRAMUSCULAR | Status: AC
  Filled 2023-03-14: qty 2

## 2023-03-14 MED ORDER — PROPOFOL 500 MG/50ML IV EMUL
INTRAVENOUS | Status: AC
  Filled 2023-03-14: qty 50

## 2023-03-14 MED ORDER — LACTATED RINGERS IV SOLN: INTRAVENOUS | Status: DC | PRN

## 2023-03-14 NOTE — Anesthesia Postprocedure Evaluation (Signed)
Anesthesia Post Note  Patient: Bryan Kent  Procedure(s) Performed: ESOPHAGOGASTRODUODENOSCOPY (EGD) WITH PROPOFOL BIOPSY  Patient location during evaluation: PACU Anesthesia Type: General Level of consciousness: awake and alert Pain management: pain level controlled Vital Signs Assessment: post-procedure vital signs reviewed and stable Respiratory status: spontaneous breathing, nonlabored ventilation, respiratory function stable and patient connected to nasal cannula oxygen Cardiovascular status: blood pressure returned to baseline and stable Postop Assessment: no apparent nausea or vomiting Anesthetic complications: no   There were no known notable events for this encounter.   Last Vitals:  Vitals:   03/14/23 0855 03/14/23 0900  BP: 97/65 (!) 101/55  Pulse: 83 78  Resp: (!) 22 19  Temp: 36.8 C   SpO2: 97% 97%    Last Pain:  Vitals:   03/14/23 0855  TempSrc: Axillary  PainSc: 0-No pain                 Romulus Hanrahan L Francie Keeling

## 2023-03-14 NOTE — Op Note (Signed)
[facility] Patient Name: Bryan Kent Procedure Date: 03/14/2023 8:28 AM MRN: [id number] Date of Birth: 03-20-1951 Attending MD: Hennie Duos. Marletta Lor , [place], [id number] CSN: [id number] Age: 72 Admit Type: Outpatient Procedure:                Upper GI endoscopy Indications:              Surveillance for malignancy due to personal history                            of Barrett's esophagus Providers:                Hennie Duos. Marletta Lor, DO, Angelica Ran, Lennice Sites                            Technician, Technician Referring MD:              Medicines:                See the Anesthesia note for documentation of the                            administered medications Complications:            No immediate complications. Estimated Blood Loss:     Estimated blood loss was minimal. Procedure:                Pre-Anesthesia Assessment:                           - The anesthesia plan was to use monitored                            anesthesia care (MAC).                           After obtaining informed consent, the endoscope was                            passed under direct vision. Throughout the                            procedure, the patient's blood pressure, pulse, and                            oxygen saturations were monitored continuously. The                            [id number]-[id number] ([id number]) scope was introduced through the                            mouth, and advanced to the second part of duodenum.                            The upper GI endoscopy was accomplished without                            difficulty. The patient  tolerated the procedure                            well. Scope In: 8:46:25 AM Scope Out: 8:52:18 AM Total Procedure Duration: 0 hours 5 minutes 53 seconds  Findings:      The esophagus and gastroesophageal junction were examined with white       light and narrow band imaging (NBI) from a forward view and retroflexed       position. There were esophageal mucosal  changes secondary to established       short-segment Barrett's disease. These changes involved the mucosa       extending to the Z-line. Circumferential salmon-colored mucosa was       present. The maximum longitudinal extent of these esophageal mucosal       changes was 1 cm in length. Mucosa was biopsied with a cold forceps for       histology. One specimen bottle was sent to pathology.      A deformity was found at the pylorus from prior perforated ulcer      The duodenal bulb, first portion of the duodenum and second portion of       the duodenum were normal. Impression:               - Esophageal mucosal changes secondary to                            established short-segment Barrett's disease.                            Biopsied.                           - Acquired deformity in the pylorus.                           - Normal duodenal bulb, first portion of the                            duodenum and second portion of the duodenum. Moderate Sedation:      Per Anesthesia Care Recommendation:           - Patient has a contact number available for                            emergencies. The signs and symptoms of potential                            delayed complications were discussed with the                            patient. Return to normal activities tomorrow.                            Written discharge instructions were provided to the                            patient.                           -  Resume previous diet.                           - Continue present medications.                           - Await pathology results.                           - Repeat upper endoscopy for surveillance depedning                            on pathology results.                           - Return to GI clinic in 6 months. Procedure Code(s):        --- Professional ---                           [phone], Esophagogastroduodenoscopy, flexible,                            transoral; with  biopsy, single or multiple Diagnosis Code(s):        --- Professional ---                           K22.70, Barrett's esophagus without dysplasia                           K31.89, Other diseases of stomach and duodenum CPT copyright 2022 American Medical Association. All rights reserved. The codes documented in this report are preliminary and upon coder review may  be revised to meet current compliance requirements. Hennie Duos. Marletta Lor, DO Hennie Duos. Marletta Lor, DO 03/14/2023 8:57:18 AM This report has been signed electronically. Number of Addenda: 0

## 2023-03-14 NOTE — H&P (Signed)
Primary Care Physician:  Hulan Saas, FNP Primary Gastroenterologist:  Dr. Marletta Lor  Pre-Procedure History & Physical: HPI:  Bryan Kent is a 72 y.o. male is here for an EGD to be performed for history of Barrett's esophagus  Past Medical History:  Diagnosis Date   Arthritis    Cancer [facility])    COPD (chronic obstructive pulmonary disease) (HCC)    Coronary artery disease    Diabetes mellitus without complication (HCC)    Difficult intubation    Glidescope used [facility] 12/02/16, grade 3 view   Hyperlipidemia    Hypertension    PTSD (post-traumatic stress disorder)    Rheumatoid arthritis (HCC)     Past Surgical History:  Procedure Laterality Date   BACK SURGERY     fusion   CORONARY ATHERECTOMY N/A 06/19/2020   Procedure: CORONARY ATHERECTOMY;  Surgeon: Runell Gess, MD;  Location: [facility] INVASIVE CV LAB;  Service: Cardiovascular;  Laterality: N/A;   CORONARY IMAGING/OCT N/A 06/19/2020   Procedure: INTRAVASCULAR IMAGING/OCT;  Surgeon: Runell Gess, MD;  Location: [facility] INVASIVE CV LAB;  Service: Cardiovascular;  Laterality: N/A;   CORONARY PRESSURE/FFR WITH 3D MAPPING N/A 06/19/2020   Procedure: Coronary Pressure Wire/FFR w/3D Mapping;  Surgeon: Runell Gess, MD;  Location: [facility] INVASIVE CV LAB;  Service: Cardiovascular;  Laterality: N/A;   CORONARY STENT INTERVENTION  06/19/2020   CORONARY STENT INTERVENTION N/A 06/19/2020   Procedure: CORONARY STENT INTERVENTION;  Surgeon: Runell Gess, MD;  Location: [facility] INVASIVE CV LAB;  Service: Cardiovascular;  Laterality: N/A;   gastric ulcer     KNEE SURGERY     LEFT HEART CATH AND CORONARY ANGIOGRAPHY N/A 06/19/2020   Procedure: LEFT HEART CATH AND CORONARY ANGIOGRAPHY;  Surgeon: Runell Gess, MD;  Location: [facility] INVASIVE CV LAB;  Service: Cardiovascular;  Laterality: N/A;   REPAIR OF PERFORATED ULCER     ROTATOR CUFF REPAIR Right    TRANSFORAMINAL LUMBAR INTERBODY FUSION W/ MIS 1 LEVEL Left 04/21/2021    Procedure: Minimally Invasive  Left, Lumbar three- lumbar four Transforaminal Lumbar Interbody Fusion With Revision of hardware, Exploration of fusion Lumbar four - lumbar five, Lumbar five- Sacral one;  Surgeon: Dawley, Alan Mulder, DO;  Location: [facility] OR;  Service: Neurosurgery;  Laterality: Left;    Prior to Admission medications   Medication Sig Start Date End Date Taking? Authorizing Provider  albuterol (ACCUNEB) 0.63 MG/3ML nebulizer solution Take 1 ampule by nebulization in the morning and at bedtime.   Yes [provider]  arformoterol (BROVANA) 15 MCG/2ML NEBU Take 15 mcg by nebulization 2 (two) times daily.   Yes [provider]  budesonide-formoterol (SYMBICORT) 80-4.5 MCG/ACT inhaler Inhale 1 puff into the lungs in the morning and at bedtime.   Yes [provider]  guaifenesin (HUMIBID E) 400 MG TABS tablet Take 400 mg by mouth every 6 (six) hours as needed (cough/congestion).   Yes [provider]  Ipratropium-Albuterol (COMBIVENT) 20-100 MCG/ACT AERS respimat Inhale 1 puff into the lungs every 6 (six) hours as needed for wheezing or shortness of breath. 03/03/21  Yes Coralyn Helling, MD  methocarbamol (ROBAXIN-750) 750 MG tablet Take 1 tablet (750 mg total) by mouth 4 (four) times daily. 04/23/21  Yes Council Mechanic, NP  oxyCODONE-acetaminophen (PERCOCET/ROXICET) 5-325 MG tablet Take 1 tablet by mouth every 4 (four) hours as needed. 10/27/21  Yes Triplett, Tammy, PA-C  ALPRAZolam (XANAX) 0.5 MG tablet Take 0.5 mg by mouth 2 (two) times daily as needed for anxiety.  [provider]  aspirin EC 81 MG tablet Take 81 mg by mouth daily. Swallow whole.    [provider]  azithromycin (ZITHROMAX) 250 MG tablet Take 250 mg by mouth every Monday, Wednesday, and Friday.    [provider]  empagliflozin (JARDIANCE) 25 MG TABS tablet Take 25 mg by mouth daily.    [provider]  folic acid (FOLVITE) 1 MG tablet Take 1 mg by mouth  daily.  07/07/16   [provider]  glipiZIDE (GLUCOTROL) 10 MG tablet Take 10 mg by mouth daily with supper.    [provider]  hydroxychloroquine (PLAQUENIL) 200 MG tablet Take 200 mg by mouth daily. 07/07/16   [provider]  metFORMIN (GLUCOPHAGE) 1000 MG tablet Take 1,000 mg by mouth 2 (two) times daily with a meal.    [provider]  methotrexate (RHEUMATREX) 2.5 MG tablet Take 20 mg by mouth every Saturday. 01/05/17   [provider]  nitroGLYCERIN (NITROSTAT) 0.4 MG SL tablet PLACE 1 TABLET (0.4 MG TOTAL) UNDER THE TONGUE EVERY FIVE MINUTES AS NEEDED FOR CHEST PAIN. 06/20/20 11/10/22  Corrin Parker, PA-C  oxyCODONE-acetaminophen (PERCOCET/ROXICET) 5-325 MG tablet Take 1 tablet by mouth every 6 (six) hours as needed for severe pain. 02/07/22   Vanetta Mulders, MD  pantoprazole (PROTONIX) 40 MG tablet TAKE 1 TABLET (40 MG TOTAL) BY MOUTH DAILY. 06/20/20 11/10/22  Marjie Skiff E, PA-C  pioglitazone (ACTOS) 30 MG tablet Take 30 mg by mouth daily.     [provider]  predniSONE (DELTASONE) 5 MG tablet Take 5 mg by mouth daily with breakfast.    [provider]  propranolol (INDERAL) 10 MG tablet Take 10 mg by mouth 2 (two) times daily as needed (anxiety/PTSD).    [provider]  rosuvastatin (CRESTOR) 5 MG tablet Take 1 tablet (5 mg total) by mouth daily. 06/20/20 11/10/22  Marjie Skiff E, PA-C  Semaglutide (OZEMPIC, 1 MG/DOSE, [place]) Inject 1 mg into the skin once a week. Saturday    [provider]    Allergies as of 01/17/2023 - Review Complete 01/06/2023  Allergen Reaction Noted   Lobster [shellfish allergy] Hives 08/18/2017   Benzalkonium chloride Rash 07/07/2016   Pravastatin Rash 11/24/2016   Simvastatin Rash 11/24/2016   Neosporin [bacitracin-polymyxin b]  12/07/2018    Family History  Problem Relation Age of Onset   Heart disease Father    Asthma Mother     Social History   Socioeconomic  History   Marital status: Divorced    Spouse name: Not on file   Number of children: Not on file   Years of education: Not on file   Highest education level: Not on file  Occupational History   Occupation: Retired - Armed forces operational officer   Occupation: [place] Veteran  Tobacco Use   Smoking status: Former    Current packs/day: 1.00    Average packs/day: 1 pack/day for 35.0 years (35.0 ttl pk-yrs)    Types: Cigarettes   Smokeless tobacco: Never  Vaping Use   Vaping status: Never Used  Substance and Sexual Activity   Alcohol use: Yes    Comment: very rarely   Drug use: Never   Sexual activity: Not on file  Other Topics Concern   Not on file  Social History Narrative   Not on file   Social Determinants of Health   Financial Resource Strain: Not on file  Food Insecurity: Not on file  Transportation Needs: Not on file  Physical Activity: Not on file  Stress: Not on file  Social Connections: Unknown (08/31/2021)   Received from [facility], [facility]   Social Network    Social Network: Not on file  Intimate Partner Violence: Unknown (07/23/2021)   Received from [facility], [facility]   HITS    Physically Hurt: Not on file    Insult or Talk Down To: Not on file    Threaten Physical Harm: Not on file    Scream or Curse: Not on file    Review of Systems: General: Negative for fever, chills, fatigue, weakness. Eyes: Negative for vision changes.  ENT: Negative for hoarseness, difficulty swallowing , nasal congestion. CV: Negative for chest pain, angina, palpitations, dyspnea on exertion, peripheral edema.  Respiratory: Negative for dyspnea at rest, dyspnea on exertion, cough, sputum, wheezing.  GI: See history of present illness. GU:  Negative for dysuria, hematuria, urinary incontinence, urinary frequency, nocturnal urination.  MS: Negative for joint pain, low back pain.  Derm: Negative for rash or itching.  Neuro: Negative for weakness, abnormal sensation, seizure,  frequent headaches, memory loss, confusion.  Psych: Negative for anxiety, depression Endo: Negative for unusual weight change.  Heme: Negative for bruising or bleeding. Allergy: Negative for rash or hives.  Physical Exam: Vital signs in last 24 hours: Temp:  [98.4 F (36.9 C)] 98.4 F (36.9 C) (11/25 0751) Pulse Rate:  [81] 81 (11/25 0751) Resp:  [21] 21 (11/25 0751) BP: (149)/(77) 149/77 (11/25 0751) SpO2:  [94 %] 94 % (11/25 0751)   General:   Alert,  Well-developed, well-nourished, pleasant and cooperative in NAD Head:  Normocephalic and atraumatic. Eyes:  Sclera clear, no icterus.   Conjunctiva pink. Ears:  Normal auditory acuity. Nose:  No deformity, discharge,  or lesions. Msk:  Symmetrical without gross deformities. Normal posture. Extremities:  Without clubbing or edema. Neurologic:  Alert and  oriented x4;  grossly normal neurologically. Skin:  Intact without significant lesions or rashes. Psych:  Alert and cooperative. Normal mood and affect.   Impression/Plan: Bryan Kent is here for an EGD to be performed for history of Barrett's esophagus  Risks, benefits, limitations, imponderables and alternatives regarding procedure have been reviewed with the patient. Questions have been answered. All parties agreeable.

## 2023-03-14 NOTE — Anesthesia Preprocedure Evaluation (Signed)
Anesthesia Evaluation  Patient identified by MRN, date of birth, ID band Patient awake    Reviewed: Patient's Chart, lab work & pertinent test results  History of Anesthesia Complications (+) DIFFICULT AIRWAY and history of anesthetic complications  Airway Mallampati: II  TM Distance: >3 FB     Dental no notable dental hx. (+) Dental Advisory Given, Teeth Intact   Pulmonary COPD, former smoker   Pulmonary exam normal breath sounds clear to auscultation       Cardiovascular hypertension, + CAD  Normal cardiovascular exam Rhythm:Regular Rate:Normal     Neuro/Psych  PSYCHIATRIC DISORDERS Anxiety     PTSD   GI/Hepatic negative GI ROS, Neg liver ROS,,,  Endo/Other  diabetes, Type 2    Renal/GU negative Renal ROS     Musculoskeletal  (+) Arthritis , Rheumatoid disorders,    Abdominal Normal abdominal exam  (+)   Peds  Hematology   Anesthesia Other Findings cancer  Reproductive/Obstetrics                             Anesthesia Physical Anesthesia Plan  ASA: 3  Anesthesia Plan: General   Post-op Pain Management:    Induction: Intravenous  PONV Risk Score and Plan: Propofol infusion  Airway Management Planned: Nasal Cannula and Natural Airway  Additional Equipment: None  Intra-op Plan:   Post-operative Plan:   Informed Consent: I have reviewed the patients History and Physical, chart, labs and discussed the procedure including the risks, benefits and alternatives for the proposed anesthesia with the patient or authorized representative who has indicated his/her understanding and acceptance.     Dental advisory given  Plan Discussed with: CRNA  Anesthesia Plan Comments: (PAT note by Bryan Poles, PA-C: Follows with cardiology for history of CAD s/p PCI to the proximal and mid LAD March 2022.  He is primarily followed by cardiology at the [facility], however he was seen by [name], NP on 04/03/2021 for preop evaluation.  Per note, "Preoperative cardiac evaluation:He is due to have L5-S1 repair. Chart reviewed as part of pre-operative protocol coverage. Given past medical history and time since last visit, based on ACC/AHA guidelines,Bryan B Riverswould be at acceptable risk for the planned procedure without further cardiovascular testing. We will need to hold Plavix 5 days prior to his surgery. Start again as soon as possible thereafter. We will talk about stopping Plavix and March 2023 follow-up."  Former smoker (quit 2016) with history of COPD.  He is maintained on Brovana daily and Combivent as needed.  He sporadically uses Symbicort.  Seen by pulmonologist Dr. Craige Kent 03/03/2021 for preop evaluation.  Per note, "COPD from emphysema. - will maintained on his current regimen - no additional testing needed at this time - he plans to resume follow up at the [facility]. - he is followed by Dr. Kendell Bane Kent with neurosurgery - no pulmonary contraindications for him to proceed with surgery."  History of rheumatoid arthritis maintained on methotrexate and Plaquenil.  Intermittently use prednisone for flares.  DM2, uncontrolled, not on insulin.  This is followed at the [place].  Last A1c 9.5 on 03/02/2021.  Review of anesthesia notes from [facility] 12/02/16 indicates difficult intubation, grade 3 view, glidescope used. Per intubation note, "An Intubation: Smooth IV induction after adequate preoxygenation. 2 hand mask ventilation. Eyes taped prior to airway manipulation. DL x1 with Mac 4 - grade III view. Glidescope #3 brought to room. Clear view of glottic opening,  ETT through glottic opening atraumatically. Secured at 23 cm after bilateral breath sounds and +ETCO2 confirmed. Dentition & lips remain intact as pre-op assessment."  Patient will need day of surgery labs and evaluation.  EKG 04/03/2021: Sinus rhythm with short PR (PR interval 94 ms).  Rate 86.  TTE 12/10/20 (care  everywhere): Interpretation Summary Left ventricular systolic function is normal. EF= 60% by 2D biplane method. No regional wall motion abnormalities noted. Grade I diastolic dysfunction, (abnormal relaxation pattern). No hemodynamically significant valvular aortic stenosis. There is trace mitral regurgitation. There is mild tricuspid regurgitation. Right ventricular systolic pressure is estimated at 30 mmHg.  Cath and PCI 06/19/2020: . Prox RCA to Mid RCA lesion is 40% stenosed. . Prox LAD lesion is 70% stenosed. . Mid LAD lesion is 95% stenosed. . A drug-eluting stent was successfully placed using a SYNERGY XD 2.75X28. Marland Kitchen Post intervention, there is a 0% residual stenosis. . A drug-eluting stent was successfully placed using a SYNERGY XD 3.50X16. Marland Kitchen Post intervention, there is a 0% residual stenosis. . The left ventricular systolic function is normal. . LV end diastolic pressure is normal. . The left ventricular ejection fraction is 50-55% by visual estimate.  )        Anesthesia Quick Evaluation

## 2023-03-14 NOTE — Transfer of Care (Signed)
Immediate Anesthesia Transfer of Care Note  Patient: Bryan Kent  Procedure(s) Performed: ESOPHAGOGASTRODUODENOSCOPY (EGD) WITH PROPOFOL BIOPSY  Patient Location: Short Stay  Anesthesia Type:General  Level of Consciousness: awake, alert , oriented, and patient cooperative  Airway & Oxygen Therapy: Patient Spontanous Breathing and Patient connected to nasal cannula oxygen  Post-op Assessment: Report given to RN, Post -op Vital signs reviewed and stable, and Patient moving all extremities X 4  Post vital signs: Reviewed and stable  Last Vitals:  Vitals Value Taken Time  BP 97/65 03/14/23 0855  Temp 36.8 C 03/14/23 0855  Pulse 83 03/14/23 0855  Resp 22 03/14/23 0855  SpO2 97 % 03/14/23 0855    Last Pain:  Vitals:   03/14/23 0855  TempSrc: Axillary  PainSc: 0-No pain         Complications: No notable events documented.

## 2023-03-14 NOTE — Discharge Instructions (Addendum)
EGD Discharge instructions Please read the instructions outlined below and refer to this sheet in the next few weeks. These discharge instructions provide you with general information on caring for yourself after you leave the hospital. Your doctor may also give you specific instructions. While your treatment has been planned according to the most current medical practices available, unavoidable complications occasionally occur. If you have any problems or questions after discharge, please call your doctor. ACTIVITY You may resume your regular activity but move at a slower pace for the next 24 hours.  Take frequent rest periods for the next 24 hours.  Walking will help expel (get rid of) the air and reduce the bloated feeling in your abdomen.  No driving for 24 hours (because of the anesthesia (medicine) used during the test).  You may shower.  Do not sign any important legal documents or operate any machinery for 24 hours (because of the anesthesia used during the test).  NUTRITION Drink plenty of fluids.  You may resume your normal diet.  Begin with a light meal and progress to your normal diet.  Avoid alcoholic beverages for 24 hours or as instructed by your caregiver.  MEDICATIONS You may resume your normal medications unless your caregiver tells you otherwise.  WHAT YOU CAN EXPECT TODAY You may experience abdominal discomfort such as a feeling of fullness or "gas" pains.  FOLLOW-UP Your doctor will discuss the results of your test with you.  SEEK IMMEDIATE MEDICAL ATTENTION IF ANY OF THE FOLLOWING OCCUR: Excessive nausea (feeling sick to your stomach) and/or vomiting.  Severe abdominal pain and distention (swelling).  Trouble swallowing.  Temperature over 101 F (37.8 C).  Rectal bleeding or vomiting of blood.    Your upper endoscopy revealed short segment Barrett's esophagus.  I took numerous biopsies of the area.  We will call with results.  Stomach and small bowel otherwise  normal.  Continue on pantoprazole daily.  Follow-up in GI office in 6 months.  I hope you have a great rest of your week!  Hennie Duos. Marletta Lor, D.O. Gastroenterology and Hepatology [facility]

## 2023-03-15 LAB — SURGICAL PATHOLOGY

## 2023-03-22 ENCOUNTER — Encounter (HOSPITAL_COMMUNITY): Payer: Self-pay | Admitting: Internal Medicine

## 2023-08-04 ENCOUNTER — Encounter: Payer: Self-pay | Admitting: Internal Medicine

## 2023-09-07 ENCOUNTER — Emergency Department (HOSPITAL_COMMUNITY)

## 2023-09-07 ENCOUNTER — Other Ambulatory Visit: Payer: Self-pay

## 2023-09-07 ENCOUNTER — Encounter (HOSPITAL_COMMUNITY): Payer: Self-pay

## 2023-09-07 ENCOUNTER — Observation Stay (HOSPITAL_COMMUNITY)
Admission: EM | Admit: 2023-09-07 | Discharge: 2023-09-08 | Disposition: A | Discharge status: Home / Self Care | Attending: Family Medicine | Admitting: Family Medicine

## 2023-09-07 DIAGNOSIS — I2510 Atherosclerotic heart disease of native coronary artery without angina pectoris: Secondary | ICD-10-CM | POA: Diagnosis present

## 2023-09-07 DIAGNOSIS — R42 Dizziness and giddiness: Secondary | ICD-10-CM | POA: Diagnosis not present

## 2023-09-07 DIAGNOSIS — I10 Essential (primary) hypertension: Secondary | ICD-10-CM | POA: Diagnosis present

## 2023-09-07 DIAGNOSIS — E118 Type 2 diabetes mellitus with unspecified complications: Secondary | ICD-10-CM | POA: Diagnosis present

## 2023-09-07 DIAGNOSIS — Z79899 Other long term (current) drug therapy: Secondary | ICD-10-CM | POA: Insufficient documentation

## 2023-09-07 DIAGNOSIS — Z87891 Personal history of nicotine dependence: Secondary | ICD-10-CM | POA: Diagnosis not present

## 2023-09-07 DIAGNOSIS — Z859 Personal history of malignant neoplasm, unspecified: Secondary | ICD-10-CM | POA: Insufficient documentation

## 2023-09-07 DIAGNOSIS — M069 Rheumatoid arthritis, unspecified: Secondary | ICD-10-CM | POA: Diagnosis not present

## 2023-09-07 DIAGNOSIS — Z7982 Long term (current) use of aspirin: Secondary | ICD-10-CM | POA: Diagnosis not present

## 2023-09-07 DIAGNOSIS — Z7984 Long term (current) use of oral hypoglycemic drugs: Secondary | ICD-10-CM | POA: Insufficient documentation

## 2023-09-07 DIAGNOSIS — E119 Type 2 diabetes mellitus without complications: Secondary | ICD-10-CM | POA: Diagnosis not present

## 2023-09-07 DIAGNOSIS — Z955 Presence of coronary angioplasty implant and graft: Secondary | ICD-10-CM | POA: Insufficient documentation

## 2023-09-07 DIAGNOSIS — J449 Chronic obstructive pulmonary disease, unspecified: Secondary | ICD-10-CM | POA: Diagnosis not present

## 2023-09-07 DIAGNOSIS — E785 Hyperlipidemia, unspecified: Secondary | ICD-10-CM | POA: Diagnosis present

## 2023-09-07 HISTORY — DX: Dizziness and giddiness: R42

## 2023-09-07 LAB — COMPREHENSIVE METABOLIC PANEL WITH GFR
ALT: 23 U/L (ref 0–44)
AST: 16 U/L (ref 15–41)
Albumin: 3.9 g/dL (ref 3.5–5.0)
Alkaline Phosphatase: 75 U/L (ref 38–126)
Anion gap: 9 (ref 5–15)
BUN: 21 mg/dL (ref 8–23)
CO2: 25 mmol/L (ref 22–32)
Calcium: 8.9 mg/dL (ref 8.9–10.3)
Chloride: 100 mmol/L (ref 98–111)
Creatinine, Ser: 0.72 mg/dL (ref 0.61–1.24)
GFR, Estimated: >60 mL/min (ref >60)
Glucose, Bld: 218 mg/dL — ABNORMAL HIGH (ref 70–99)
Potassium: 4.1 mmol/L (ref 3.5–5.1)
Sodium: 134 mmol/L — ABNORMAL LOW (ref 135–145)
Total Bilirubin: 0.6 mg/dL (ref 0.0–1.2)
Total Protein: 7.2 g/dL (ref 6.5–8.1)

## 2023-09-07 LAB — CBC WITH DIFFERENTIAL/PLATELET
Abs Immature Granulocytes: 0.05 10*3/uL (ref 0.00–0.07)
Basophils Absolute: 0.1 10*3/uL (ref 0.0–0.1)
Basophils Relative: 1 %
Eosinophils Absolute: 0.3 10*3/uL (ref 0.0–0.5)
Eosinophils Relative: 2 %
HCT: 43.8 % (ref 39.0–52.0)
Hemoglobin: 14.3 g/dL (ref 13.0–17.0)
Immature Granulocytes: 0 %
Lymphocytes Relative: 7 %
Lymphs Abs: 0.9 10*3/uL (ref 0.7–4.0)
MCH: 33.8 pg (ref 26.0–34.0)
MCHC: 32.6 g/dL (ref 30.0–36.0)
MCV: 103.5 fL — ABNORMAL HIGH (ref 80.0–100.0)
Monocytes Absolute: 0.9 10*3/uL (ref 0.1–1.0)
Monocytes Relative: 7 %
Neutro Abs: 10.4 10*3/uL — ABNORMAL HIGH (ref 1.7–7.7)
Neutrophils Relative %: 83 %
Platelets: 247 10*3/uL (ref 150–400)
RBC: 4.23 MIL/uL (ref 4.22–5.81)
RDW: 14.3 % (ref 11.5–15.5)
WBC: 12.7 10*3/uL — ABNORMAL HIGH (ref 4.0–10.5)
nRBC: 0 % (ref 0.0–0.2)

## 2023-09-07 LAB — URINALYSIS, ROUTINE W REFLEX MICROSCOPIC
Bacteria, UA: NONE SEEN
Bilirubin Urine: NEGATIVE
Glucose, UA: >=500 mg/dL — AB
Hgb urine dipstick: NEGATIVE
Ketones, ur: 5 mg/dL — AB
Leukocytes,Ua: NEGATIVE
Nitrite: NEGATIVE
Protein, ur: 30 mg/dL — AB
Specific Gravity, Urine: 1.02 (ref 1.005–1.030)
pH: 6 (ref 5.0–8.0)

## 2023-09-07 LAB — GLUCOSE, CAPILLARY: Glucose-Capillary: 251 mg/dL — ABNORMAL HIGH (ref 70–99)

## 2023-09-07 LAB — CBG MONITORING, ED: Glucose-Capillary: 173 mg/dL — ABNORMAL HIGH (ref 70–99)

## 2023-09-07 LAB — TROPONIN I (HIGH SENSITIVITY): Troponin I (High Sensitivity): 9 ng/L (ref <18)

## 2023-09-07 MED ORDER — HEPARIN SODIUM (PORCINE) 5000 UNIT/ML IJ SOLN: 5000.0000 [IU] | Freq: Three times a day (TID) | INTRAMUSCULAR | Status: DC

## 2023-09-07 MED ORDER — MECLIZINE HCL 12.5 MG PO TABS
25.0000 mg | ORAL_TABLET | Freq: Once | ORAL | Status: AC
  Administered 2023-09-07: 25 mg via ORAL
  Filled 2023-09-07: qty 2

## 2023-09-07 MED ORDER — ARFORMOTEROL TARTRATE 15 MCG/2ML IN NEBU
15.0000 ug | INHALATION_SOLUTION | Freq: Two times a day (BID) | RESPIRATORY_TRACT | Status: DC
  Administered 2023-09-08: 15 ug via RESPIRATORY_TRACT
  Filled 2023-09-07: qty 2

## 2023-09-07 MED ORDER — AZITHROMYCIN 250 MG PO TABS
250.0000 mg | ORAL_TABLET | ORAL | Status: DC
  Administered 2023-09-07: 250 mg via ORAL
  Filled 2023-09-07: qty 1

## 2023-09-07 MED ORDER — SCOPOLAMINE 1 MG/3DAYS TD PT72
1.0000 | MEDICATED_PATCH | TRANSDERMAL | Status: DC
  Administered 2023-09-07: 1.5 mg via TRANSDERMAL
  Filled 2023-09-07: qty 1

## 2023-09-07 MED ORDER — FOLIC ACID 1 MG PO TABS
1.0000 mg | ORAL_TABLET | Freq: Every day | ORAL | Status: DC
  Administered 2023-09-07 – 2023-09-08 (×2): 1 mg via ORAL
  Filled 2023-09-07 (×2): qty 1

## 2023-09-07 MED ORDER — INSULIN ASPART 100 UNIT/ML IJ SOLN
0.0000 [IU] | Freq: Three times a day (TID) | INTRAMUSCULAR | Status: DC
Start: 2023-09-08 — End: 2023-09-08
  Administered 2023-09-08: 3 [IU] via SUBCUTANEOUS

## 2023-09-07 MED ORDER — ACETAMINOPHEN 650 MG RE SUPP: 650.0000 mg | Freq: Four times a day (QID) | RECTAL | Status: DC | PRN

## 2023-09-07 MED ORDER — DIPHENHYDRAMINE HCL 50 MG/ML IJ SOLN
25.0000 mg | Freq: Once | INTRAMUSCULAR | Status: AC
  Administered 2023-09-07: 25 mg via INTRAVENOUS
  Filled 2023-09-07: qty 1

## 2023-09-07 MED ORDER — DIAZEPAM 5 MG PO TABS
5.0000 mg | ORAL_TABLET | Freq: Once | ORAL | Status: AC
Start: 2023-09-07 — End: 2023-09-07
  Administered 2023-09-07: 5 mg via ORAL
  Filled 2023-09-07: qty 1

## 2023-09-07 MED ORDER — DEXAMETHASONE SODIUM PHOSPHATE 4 MG/ML IJ SOLN
4.0000 mg | INTRAMUSCULAR | Status: DC
  Administered 2023-09-07: 4 mg via INTRAVENOUS
  Filled 2023-09-07: qty 1

## 2023-09-07 MED ORDER — MECLIZINE HCL 12.5 MG PO TABS
25.0000 mg | ORAL_TABLET | Freq: Three times a day (TID) | ORAL | Status: AC
  Administered 2023-09-07 – 2023-09-08 (×2): 25 mg via ORAL
  Filled 2023-09-07 (×2): qty 2

## 2023-09-07 MED ORDER — EMPAGLIFLOZIN 25 MG PO TABS
25.0000 mg | ORAL_TABLET | Freq: Every day | ORAL | Status: DC
  Administered 2023-09-08: 25 mg via ORAL
  Filled 2023-09-07 (×2): qty 1

## 2023-09-07 MED ORDER — FLUTICASONE FUROATE-VILANTEROL 100-25 MCG/ACT IN AEPB
1.0000 | INHALATION_SPRAY | Freq: Every day | RESPIRATORY_TRACT | Status: DC
  Administered 2023-09-08: 1 via RESPIRATORY_TRACT
  Filled 2023-09-07: qty 28

## 2023-09-07 MED ORDER — SODIUM CHLORIDE 0.9 % IV SOLN: INTRAVENOUS | Status: DC

## 2023-09-07 MED ORDER — LORAZEPAM 2 MG/ML IJ SOLN
1.0000 mg | Freq: Once | INTRAMUSCULAR | Status: AC
  Administered 2023-09-07: 1 mg via INTRAVENOUS
  Filled 2023-09-07: qty 1

## 2023-09-07 MED ORDER — ASPIRIN 81 MG PO TBEC
81.0000 mg | DELAYED_RELEASE_TABLET | Freq: Every day | ORAL | Status: DC
  Administered 2023-09-07 – 2023-09-08 (×2): 81 mg via ORAL
  Filled 2023-09-07 (×2): qty 1

## 2023-09-07 MED ORDER — GLYBURIDE 1.25 MG PO TABS
1.2500 mg | ORAL_TABLET | Freq: Two times a day (BID) | ORAL | Status: DC
  Filled 2023-09-07 (×3): qty 1

## 2023-09-07 MED ORDER — ROSUVASTATIN CALCIUM 5 MG PO TABS
2.5000 mg | ORAL_TABLET | Freq: Every day | ORAL | Status: DC
  Administered 2023-09-08: 2.5 mg via ORAL
  Filled 2023-09-07 (×2): qty 0.5

## 2023-09-07 MED ORDER — GLYBURIDE-METFORMIN 1.25-250 MG PO TABS
1.0000 | ORAL_TABLET | Freq: Two times a day (BID) | ORAL | Status: DC
Start: 2023-09-08 — End: 2023-09-07

## 2023-09-07 MED ORDER — ACETAMINOPHEN 325 MG PO TABS: 650.0000 mg | ORAL_TABLET | Freq: Four times a day (QID) | ORAL | Status: DC | PRN

## 2023-09-07 MED ORDER — METOCLOPRAMIDE HCL 5 MG/ML IJ SOLN
5.0000 mg | Freq: Once | INTRAMUSCULAR | Status: AC
  Administered 2023-09-07: 5 mg via INTRAVENOUS
  Filled 2023-09-07: qty 2

## 2023-09-07 MED ORDER — PROPRANOLOL HCL 20 MG PO TABS: 10.0000 mg | ORAL_TABLET | Freq: Two times a day (BID) | ORAL | Status: DC | PRN

## 2023-09-07 MED ORDER — METFORMIN HCL 500 MG PO TABS: 250.0000 mg | ORAL_TABLET | Freq: Two times a day (BID) | ORAL | Status: DC

## 2023-09-07 MED ORDER — PREDNISONE 5 MG PO TABS
5.0000 mg | ORAL_TABLET | Freq: Every day | ORAL | Status: DC
  Administered 2023-09-08: 5 mg via ORAL
  Filled 2023-09-07: qty 1

## 2023-09-07 MED ORDER — ALBUTEROL SULFATE (2.5 MG/3ML) 0.083% IN NEBU: 2.5000 mg | INHALATION_SOLUTION | RESPIRATORY_TRACT | Status: DC | PRN

## 2023-09-07 MED ORDER — MECLIZINE HCL 12.5 MG PO TABS: 25.0000 mg | ORAL_TABLET | Freq: Three times a day (TID) | ORAL | Status: DC

## 2023-09-07 NOTE — ED Triage Notes (Signed)
 Pt arrived via POV from home c/o dizziness and hypertension. Pt reports symptoms began this morning at apprx 0200. Pt reports he laid down and the room started spinning. Pt reports having recent left arm pain last week. Pt currently taking a blood thinner.

## 2023-09-07 NOTE — ED Notes (Signed)
 Patient transported to CT

## 2023-09-07 NOTE — H&P (Signed)
 [facility] H&P   Patient Demographics:    Bryan Kent, is a 73 y.o. male  MRN: [id number]   DOB - 02/20/1951  Admit Date - 09/07/2023  Outpatient Primary MD for the patient is [facility]  Referring MD/NP/PA: Dr Ambrosio Junker  Patient coming from: home  Chief Complaint  Patient presents with   Dizziness      HPI:    Bryan Kent  is a 73 y.o. male, past medical history for rheumatoid arthritis, COPD, CAD, diabetes mellitus, hyperlipidemia, hypertension, vertigo (last event 4 to 5 years ago) reports usually respond to meclizine, he presents to ED secondary to sudden onset of head spinning, 2 AM this morning, provoked by head movement, however whenever he tried to ambulate, he has a chronic tinnitus, but it is at baseline, he denies any tingling, numbness. - In ED MRI brain with no acute findings, he received Valium, Reglan, scopolamine and meclizine, despite that remains with significant vertigo he could not ambulate, so [facility] hospitalist consulted to admit    Review of systems:      A full 10 point Review of Systems was done, except as stated above, all other Review of Systems were negative.   With Past History of the following :    Past Medical History:  Diagnosis Date   Arthritis    Cancer (HCC)    COPD (chronic obstructive pulmonary disease) (HCC)    Coronary artery disease    Diabetes mellitus without complication (HCC)    Difficult intubation    Glidescope used [facility] 12/02/16, grade 3 view   Hyperlipidemia    Hypertension    PTSD (post-traumatic stress disorder)    Rheumatoid arthritis (HCC)    Vertigo       Past Surgical History:  Procedure Laterality Date   BACK SURGERY     fusion   BIOPSY  03/14/2023   Procedure: BIOPSY;  Surgeon: Vinetta Greening, DO;  Location: [facility];  Service: Endoscopy;;   CORONARY ATHERECTOMY N/A 06/19/2020    Procedure: CORONARY ATHERECTOMY;  Surgeon: Avanell Leigh, MD;  Location: [facility];  Service: Cardiovascular;  Laterality: N/A;   CORONARY IMAGING/OCT N/A 06/19/2020   Procedure: INTRAVASCULAR IMAGING/OCT;  Surgeon: Avanell Leigh, MD;  Location: [facility];  Service: Cardiovascular;  Laterality: N/A;   CORONARY PRESSURE/FFR WITH 3D MAPPING N/A 06/19/2020   Procedure: Coronary Pressure Wire/FFR w/3D Mapping;  Surgeon: Avanell Leigh, MD;  Location: [facility];  Service: Cardiovascular;  Laterality: N/A;   CORONARY STENT INTERVENTION  06/19/2020   CORONARY STENT INTERVENTION N/A 06/19/2020   Procedure: CORONARY STENT INTERVENTION;  Surgeon: Avanell Leigh, MD;  Location: [facility];  Service: Cardiovascular;  Laterality: N/A;   ESOPHAGOGASTRODUODENOSCOPY (EGD) WITH PROPOFOL  N/A 03/14/2023   Procedure: ESOPHAGOGASTRODUODENOSCOPY (EGD) WITH PROPOFOL ;  Surgeon: Vinetta Greening, DO;  Location: [facility];  Service: Endoscopy;  Laterality: N/A;  8:30 AM, ASA 3   gastric ulcer     KNEE SURGERY     LEFT HEART CATH AND CORONARY ANGIOGRAPHY N/A 06/19/2020   Procedure: LEFT HEART CATH AND CORONARY ANGIOGRAPHY;  Surgeon: Avanell Leigh, MD;  Location: [facility];  Service: Cardiovascular;  Laterality: N/A;   REPAIR OF PERFORATED ULCER     ROTATOR CUFF REPAIR Right    TRANSFORAMINAL LUMBAR INTERBODY FUSION W/ MIS 1 LEVEL Left 04/21/2021   Procedure: Minimally Invasive  Left, Lumbar three- lumbar four Transforaminal Lumbar Interbody Fusion With Revision of hardware, Exploration of fusion Lumbar four - lumbar five, Lumbar five- Sacral one;  Surgeon: Dawley, Colby Daub, DO;  Location: [facility];  Service: Neurosurgery;  Laterality: Left;      Social History:     Social History   Tobacco Use   Smoking status: Former    Current packs/day: 1.00    Average packs/day: 1 pack/day for 35.0 years (35.0 ttl pk-yrs)    Types: Cigarettes    Passive exposure:  Past   Smokeless tobacco: Never  Substance Use Topics   Alcohol use: Yes    Comment: very rarely       Family History :     Family History  Problem Relation Age of Onset   Heart disease Father    Asthma Mother      Home Medications:   Prior to Admission medications   Medication Sig Start Date End Date Taking? Authorizing Provider  albuterol  (ACCUNEB ) 0.63 MG/3ML nebulizer solution Take 1 ampule by nebulization in the morning and at bedtime.   Yes [provider]  arformoterol  (BROVANA ) 15 MCG/2ML NEBU Take 15 mcg by nebulization 2 (two) times daily.   Yes [provider]  aspirin  EC 81 MG tablet Take 81 mg by mouth daily. Swallow whole.   Yes [provider]  azithromycin  (ZITHROMAX ) 250 MG tablet Take 250 mg by mouth every Monday, Wednesday, and Friday.   Yes [provider]  budesonide-formoterol  (SYMBICORT) 80-4.5 MCG/ACT inhaler Inhale 1 puff into the lungs in the morning and at bedtime.   Yes [provider]  empagliflozin  (JARDIANCE ) 25 MG TABS tablet Take 25 mg by mouth daily.   Yes [provider]  folic acid (FOLVITE) 1 MG tablet Take 1 mg by mouth daily.  07/07/16  Yes [provider]  glipiZIDE  (GLUCOTROL ) 10 MG tablet Take 10 mg by mouth daily as needed (high bs).   Yes [provider]  glyBURIDE-metformin  (GLUCOVANCE) 1.25-250 MG tablet Take 1 tablet by mouth 2 (two) times daily with a meal.   Yes [provider]  hydroxychloroquine  (PLAQUENIL ) 200 MG tablet Take 200 mg by mouth daily. 07/07/16  Yes [provider]  Ipratropium-Albuterol  (COMBIVENT) 20-100 MCG/ACT AERS respimat Inhale 1 puff into the lungs every 6 (six) hours as needed for wheezing or shortness of breath. 03/03/21  Yes Sood, Vineet, MD  methocarbamol  (ROBAXIN -750) 750 MG tablet Take 1 tablet (750 mg total) by mouth 4 (four) times daily. Patient taking differently: Take 750 mg by mouth every 8 (eight) hours as needed  for muscle spasms. 04/23/21  Yes Sandie Cross, NP  methotrexate  (RHEUMATREX) 2.5 MG tablet Take 20 mg by mouth every Monday. 01/05/17  Yes [provider]  nitroGLYCERIN  (NITROSTAT ) 0.4 MG SL tablet PLACE 1 TABLET (0.4 MG TOTAL) UNDER THE TONGUE EVERY FIVE MINUTES AS NEEDED FOR CHEST PAIN. 06/20/20 09/07/23 Yes Goodrich, Callie E, PA-C  pioglitazone  (ACTOS ) 30 MG tablet Take 30 mg by mouth daily.    Yes [provider]  predniSONE  (DELTASONE ) 5 MG tablet Take 5 mg by mouth daily with breakfast.   Yes [provider]  propranolol  (INDERAL ) 10 MG tablet Take 10 mg by mouth 2 (two) times daily as needed (anxiety/PTSD).   Yes [provider]  rosuvastatin  (CRESTOR ) 5 MG tablet Take 1 tablet (5 mg total) by mouth daily. Patient taking differently: Take 2.5 mg by mouth daily. 06/20/20 09/07/23 Yes Goodrich, Callie E, PA-C     Allergies:     Allergies  Allergen Reactions   Shellfish Allergy  Anaphylaxis and Hives    Lobster**   Benzalkonium Chloride Rash   Pravastatin Rash   Simvastatin Rash   Bacitracin-Polymyxin B Dermatitis   Fluoxetine Hcl Hives and Dermatitis   Lisinopril Cough   Neomycin-Bacitracin Zn-Polymyx Dermatitis    Neosporin ointment   Rosuvastatin  Other (See Comments)    Cramp, Headache   Semaglutide  Nausea And Vomiting and Other (See Comments)    Constipation, Weight loss     Physical Exam:   Vitals  Blood pressure 137/78, pulse 81, temperature 98.2 F (36.8 C), temperature source Oral, resp. rate 15, height 5\' 8"  (1.727 m), weight 79 kg, SpO2 93%.   1. General Developed male, laying in bed, no apparent distress  2. Normal affect and insight, Not Suicidal or Homicidal, Awake Alert, Oriented X 3.  3. No F.N deficits, ALL C.Nerves Intact, Strength 5/5 all 4 extremities, Sensation intact all 4 extremities, Plantars down going.  4. Ears and Eyes appear Normal, Conjunctivae clear, PERRLA. Moist Oral Mucosa.  5. Supple Neck, No JVD, No  cervical lymphadenopathy appriciated, No Carotid Bruits.  6. Symmetrical Chest wall movement, Good air movement bilaterally, CTAB.  7. RRR, No Gallops, Rubs or Murmurs, No Parasternal Heave.  8. Positive Bowel Sounds, Abdomen Soft, No tenderness, No organomegaly appriciated,No rebound -guarding or rigidity.  9.  No Cyanosis, Normal Skin Turgor, No Skin Rash or Bruise.  10. Good muscle tone,  joints appear normal , no effusions, Normal ROM.    Data Review:    CBC Recent Labs  Lab 09/07/23 1045  WBC 12.7*  HGB 14.3  HCT 43.8  PLT 247  MCV 103.5*  MCH 33.8  MCHC 32.6  RDW 14.3  LYMPHSABS 0.9  MONOABS 0.9  EOSABS 0.3  BASOSABS 0.1   ------------------------------------------------------------------------------------------------------------------  Chemistries  Recent Labs  Lab 09/07/23 1045  NA 134*  K 4.1  CL 100  CO2 25  GLUCOSE 218*  BUN 21  CREATININE 0.72  CALCIUM  8.9  AST 16  ALT 23  ALKPHOS 75  BILITOT 0.6   ------------------------------------------------------------------------------------------------------------------ estimated creatinine clearance is 80.8 mL/min (by C-G formula based on SCr of 0.72 mg/dL). ------------------------------------------------------------------------------------------------------------------ No results for input(s): "TSH", "T4TOTAL", "T3FREE", "THYROIDAB" in the last 72 hours.  Invalid input(s): "FREET3"  Coagulation profile No results for input(s): "INR", "PROTIME" in the last 168 hours. ------------------------------------------------------------------------------------------------------------------- No results for input(s): "DDIMER" in the last 72 hours. -------------------------------------------------------------------------------------------------------------------  Cardiac Enzymes No results for input(s): "CKMB", "TROPONINI", "MYOGLOBIN" in the last 168 hours.  Invalid input(s):  "CK" ------------------------------------------------------------------------------------------------------------------    Component Value Date/Time   BNP 44.0 12/22/2018 0943     ---------------------------------------------------------------------------------------------------------------  Urinalysis    Component Value Date/Time   COLORURINE STRAW (A) 09/07/2023 1455   APPEARANCEUR CLEAR 09/07/2023 1455   LABSPEC 1.020 09/07/2023 1455   PHURINE 6.0 09/07/2023 1455   GLUCOSEU >=500 (A) 09/07/2023 1455  HGBUR NEGATIVE 09/07/2023 1455   BILIRUBINUR NEGATIVE 09/07/2023 1455   KETONESUR 5 (A) 09/07/2023 1455   PROTEINUR 30 (A) 09/07/2023 1455   NITRITE NEGATIVE 09/07/2023 1455   LEUKOCYTESUR NEGATIVE 09/07/2023 1455    ----------------------------------------------------------------------------------------------------------------   Imaging Results:    MR BRAIN WO CONTRAST Result Date: 09/07/2023 CLINICAL DATA:  Vertigo EXAM: MRI HEAD WITHOUT CONTRAST TECHNIQUE: Multiplanar, multiecho pulse sequences of the brain and surrounding structures were obtained without intravenous contrast. COMPARISON:  None Available. FINDINGS: Brain: No acute infarct. No evidence of intracranial hemorrhage. T2/FLAIR hyperintensity in the periventricular and subcortical white matter. Mild parenchymal volume loss. No edema, mass effect, or midline shift. Posterior fossa is unremarkable. Normal appearance of midline structures. The basilar cisterns are patent. No extra-axial fluid collections. Ventricles: Normal size and configuration of the ventricles. Vascular: Skull base flow voids are visualized. Skull and upper cervical spine: No focal abnormality. Sinuses/Orbits: Orbits are symmetric. Paranasal sinuses are clear. Other: Mastoid air cells are clear. IMPRESSION: No acute intracranial abnormality. Mild chronic microvascular ischemic changes. Mild parenchymal volume loss. Electronically Signed   By: Denny Flack M.D.   On: 09/07/2023 15:12   DG Chest 2 View Result Date: 09/07/2023 CLINICAL DATA:  Chest pain. EXAM: CHEST - 2 VIEW COMPARISON:  None Available. FINDINGS: The heart size and mediastinal contours are within normal limits. Minimal bibasilar subsegmental atelectasis or scarring is noted. The visualized skeletal structures are unremarkable. IMPRESSION: Minimal bibasilar subsegmental atelectasis or scarring. Electronically Signed   By: Rosalene Colon M.D.   On: 09/07/2023 13:12       Assessment & Plan:    Principal Problem:   Vertigo Active Problems:   CAD (coronary artery disease)   Hypertension   Hyperlipidemia   Type 2 diabetes mellitus with complication, without long-term current use of insulin  (HCC)   COPD (chronic obstructive pulmonary disease) (HCC)   Vertigo -To be benign positional vertigo, right brain with no acute findings. - Did not improve despite aggressive meds in ED, he received scopolamine patch, meclizine and IV Decadron  and p.o. Valium Will admit overnight for observation, will consult vestibular PT and OT-give one-time IV Decadron  4 mg, keep on scheduled meclizine x 2 doses, and will give one-time IV Ativan  COPD - No active wheezing, continue with as needed nebs at home regimen  Type 2 diabetes mellitus - Will check A1c, continue with home regimen including glipizide , metformin  and Brovana  - Hold Actos  -Will keep on insulin  sliding scale  Hypertension - Continue with home medications  Hyperlipidemia - Continue with home statin  CAD -continue with aspirin , statin and sublingual nitro  Rheumatoid arthritis - Continue with home medications    DVT Prophylaxis Heparin   AM Labs Ordered, also please review Full Orders  Family Communication: Admission, patients condition and plan of care including tests being ordered have been discussed with the patientwho indicate understanding and agree with the plan and Code Status.  Code Status full  code  Likely DC to home  Consults called: None  Admission status: Observation  Time spent in minutes : 55 minutes   Seena Dadds M.D on 09/07/2023 at 8:49 PM   [facility] - Office  [phone]

## 2023-09-07 NOTE — ED Notes (Signed)
 Pt continues to be dizzy when he gets up.

## 2023-09-07 NOTE — ED Notes (Signed)
 Nurse attempted to ambulate pt and he is leaned over table complaining that he is still dizzy and his life sucks right now. Pt verbalized his dinner tray did not come to nurse called kitchen and they are bringing his tray up.

## 2023-09-07 NOTE — ED Provider Notes (Signed)
  Physical Exam  BP (!) 147/78   Pulse 65   Temp (!) 97.5 F (36.4 C) (Oral)   Resp 19   Ht 5\' 8"  (1.727 m)   Wt 79 kg   SpO2 94%   BMI 26.48 kg/m   Physical Exam  Procedures  Procedures  ED Course / MDM   Clinical Course as of 09/07/23 1526  Wed Sep 07, 2023  1454 Still symptomatic after antivert, benadryl, and reglan; will trial valium. MRI is pending [SG]  1518 MRI neg [SG]  1526 Assumed care. 73 yo M with vertigo that is MRI negative for stroke. Still unable to walk after meclizine, reglan, and valium.  [RP]    Clinical Course User Index [RP] Ninetta Basket, MD [SG] Teddi Favors, DO   Medical Decision Making Amount and/or Complexity of Data Reviewed Labs: ordered. Radiology: ordered.  Risk Prescription drug management. Decision regarding hospitalization.   Patient given scopolamine patch.  After all these interventions still is unable to walk.  Admitted to hospitalist for further management.       Ninetta Basket, MD 09/08/23 [phone]

## 2023-09-07 NOTE — ED Provider Notes (Signed)
 [place] [facility]ERGENC[facility] DEPARTMENT AT [facility] Provider Note  CSN: [id number] Arrival date & time: 09/07/23 7829  Chief Complaint(s) Dizziness  HPI Bryan Kent is a 73 y.o. male with past medical history as below, significant for COPD, HLD, HTN, vertigo, PTSD who presents to the ED with complaint of dizzy/spinning sensation  Patient reports sudden onset of spinning sensation approximately 2 AM this morning.  Provoked by head movement or attempting to ambulate.  Felt nauseated no vomiting.  No numbness or weakness to extremities, no falls or head injuries, he has chronic tinnitus but unchanged. Hx vertigo in the past which does feel similar to sensation today.   He incidentally also reports chest pain intermittently over the past few weeks.  Last time of the chest pain was 2 days ago.  Pressure sensation, midsternal.  Last for few seconds and goes away.  Associated with dyspnea.  No diaphoresis, nausea or vomiting.  No syncope or near syncope.  Did have some radiation to his left arm.  He is currently not experiencing any chest pain.   Past Medical History Past Medical History:  Diagnosis Date   Arthritis    Cancer (HCC)    COPD (chronic obstructive pulmonary disease) (HCC)    Coronary artery disease    Diabetes mellitus without complication (HCC)    Difficult intubation    Glidescope used [facility] 12/02/16, grade 3 view   Hyperlipidemia    Hypertension    PTSD (post-traumatic stress disorder)    Rheumatoid arthritis (HCC)    Vertigo    Patient Active Problem List   Diagnosis Date Noted   Lumbar stenosis with neurogenic claudication 04/21/2021   Hypertension 06/20/2020   Hyperlipidemia 06/20/2020   Type 2 diabetes mellitus with complication, without long-term current use of insulin  (HCC) 06/20/2020   COPD (chronic obstructive pulmonary disease) (HCC) 06/20/2020   Chest pain of uncertain etiology 06/20/2020   S/P drug eluting coronary stent placement 06/20/2020   CAD  (coronary artery disease) 06/19/2020   Pain in right shoulder 12/07/2018   Home Medication(s) Prior to Admission medications   Medication Sig Start Date End Date Taking? Authorizing Provider  albuterol  (ACCUNEB ) 0.63 MG/3ML nebulizer solution Take 1 ampule by nebulization in the morning and at bedtime.    [provider]  ALPRAZolam  (XANAX ) 0.5 MG tablet Take 0.5 mg by mouth 2 (two) times daily as needed for anxiety.    [provider]  arformoterol  (BROVANA ) 15 MCG/2ML NEBU Take 15 mcg by nebulization 2 (two) times daily.    [provider]  aspirin  EC 81 MG tablet Take 81 mg by mouth daily. Swallow whole.    [provider]  azithromycin  (ZITHROMAX ) 250 MG tablet Take 250 mg by mouth every Monday, Wednesday, and Friday.    [provider]  budesonide-formoterol  (SYMBICORT) 80-4.5 MCG/ACT inhaler Inhale 1 puff into the lungs in the morning and at bedtime.    [provider]  empagliflozin  (JARDIANCE ) 25 MG TABS tablet Take 25 mg by mouth daily.    [provider]  folic acid (FOLVITE) 1 MG tablet Take 1 mg by mouth daily.  07/07/16   [provider]  glipiZIDE  (GLUCOTROL ) 10 MG tablet Take 10 mg by mouth daily with supper.    [provider]  guaifenesin  (HUMIBID E) 400 MG TABS tablet Take 400 mg by mouth every 6 (six) hours as needed (cough/congestion).    [provider]  hydroxychloroquine  (PLAQUENIL ) 200 MG tablet Take 200 mg by  mouth daily. 07/07/16   [provider]  Ipratropium-Albuterol  (COMBIVENT) 20-100 MCG/ACT AERS respimat Inhale 1 puff into the lungs every 6 (six) hours as needed for wheezing or shortness of breath. 03/03/21   Sood, Vineet, MD  metFORMIN  (GLUCOPHAGE ) 1000 MG tablet Take 1,000 mg by mouth 2 (two) times daily with a meal.    [provider]  methocarbamol  (ROBAXIN -750) 750 MG tablet Take 1 tablet (750 mg total) by mouth 4 (four) times daily. 04/23/21   Sandie Cross, NP  methotrexate  (RHEUMATREX) 2.5 MG tablet Take 20 mg by mouth every Saturday. 01/05/17   [provider]  nitroGLYCERIN  (NITROSTAT ) 0.4 MG SL tablet PLACE 1 TABLET (0.4 MG TOTAL) UNDER THE TONGUE EVERY FIVE MINUTES AS NEEDED FOR CHEST PAIN. 06/20/20 11/10/22  Goodrich, Callie E, PA-C  oxyCODONE -acetaminophen  (PERCOCET/ROXICET) 5-325 MG tablet Take 1 tablet by mouth every 4 (four) hours as needed. 10/27/21   Triplett, Tammy, PA-C  oxyCODONE -acetaminophen  (PERCOCET/ROXICET) 5-325 MG tablet Take 1 tablet by mouth every 6 (six) hours as needed for severe pain. 02/07/22   Zackowski, Scott, MD  pantoprazole  (PROTONIX ) 40 MG tablet TAKE 1 TABLET (40 MG TOTAL) BY MOUTH DAILY. 06/20/20 11/10/22  Goodrich, Callie E, PA-C  pioglitazone  (ACTOS ) 30 MG tablet Take 30 mg by mouth daily.     [provider]  predniSONE  (DELTASONE ) 5 MG tablet Take 5 mg by mouth daily with breakfast.    [provider]  propranolol  (INDERAL ) 10 MG tablet Take 10 mg by mouth 2 (two) times daily as needed (anxiety/PTSD).    [provider]  rosuvastatin  (CRESTOR ) 5 MG tablet Take 1 tablet (5 mg total) by mouth daily. 06/20/20 11/10/22  Goodrich, Callie E, PA-C  Semaglutide  (OZEMPIC , 1 MG/DOSE, [place]) Inject 1 mg into the skin once a week. Saturday    [provider]                                                                                                                                    Past Surgical History Past Surgical History:  Procedure Laterality Date   BACK SURGERY     fusion   BIOPSY  03/14/2023   Procedure: BIOPSY;  Surgeon: Vinetta Greening, DO;  Location: [facility] ENDO SUITE;  Service: Endoscopy;;   CORONARY ATHERECTOMY N/A 06/19/2020   Procedure: CORONARY ATHERECTOMY;  Surgeon: Avanell Leigh, MD;  Location: [facility] INVASIVE CV LAB;  Service: Cardiovascular;  Laterality: N/A;   CORONARY IMAGING/OCT N/A 06/19/2020   Procedure: INTRAVASCULAR IMAGING/OCT;  Surgeon: Avanell Leigh, MD;  Location: [facility] INVASIVE CV LAB;  Service: Cardiovascular;  Laterality: N/A;   CORONARY PRESSURE/FFR WITH 3D MAPPING N/A 06/19/2020   Procedure: Coronary Pressure Wire/FFR w/3D Mapping;  Surgeon: Avanell Leigh, MD;  Location: [facility] INVASIVE CV LAB;  Service: Cardiovascular;  Laterality: N/A;   CORONARY STENT INTERVENTION  06/19/2020   CORONARY STENT INTERVENTION N/A 06/19/2020  Procedure: CORONARY STENT INTERVENTION;  Surgeon: Avanell Leigh, MD;  Location: [facility] INVASIVE CV LAB;  Service: Cardiovascular;  Laterality: N/A;   ESOPHAGOGASTRODUODENOSCOPY (EGD) WITH PROPOFOL  N/A 03/14/2023   Procedure: ESOPHAGOGASTRODUODENOSCOPY (EGD) WITH PROPOFOL ;  Surgeon: Vinetta Greening, DO;  Location: [facility] END[facility] SUITE;  Service: Endoscopy;  Laterality: N/A;  8:30 AM, ASA 3   gastric ulcer     KNEE SURGERY     LEFT HEART CATH AND CORONARY ANGIOGRAPHY N/A 06/19/2020   Procedure: LEFT HEART CATH AND CORONARY ANGIOGRAPHY;  Surgeon: Avanell Leigh, MD;  Location: [facility] INVASIVE CV LAB;  Service: Cardiovascular;  Laterality: N/A;   REPAIR OF PERFORATED ULCER     ROTATOR CUFF REPAIR Right    TRANSFORAMINAL LUMBAR INTERBODY FUSION W/ MIS 1 LEVEL Left 04/21/2021   Procedure: Minimally Invasive  Left, Lumbar three- lumbar four Transforaminal Lumbar Interbody Fusion With Revision of hardware, Exploration of fusion Lumbar four - lumbar five, Lumbar five- Sacral one;  Surgeon: Dawley, Colby Daub, DO;  Location: [facility] OR;  Service: Neurosurgery;  Laterality: Left;   Family History Family History  Problem Relation Age of Onset   Heart disease Father    Asthma Mother     Social History Social History   Tobacco Use   Smoking status: Former    Current packs/day: 1.00    Average packs/day: 1 pack/day for 35.0 years (35.0 ttl pk-yrs)    Types: Cigarettes    Passive exposure: Past   Smokeless tobacco: Never  Vaping Use   Vaping status: Never Used  Substance Use Topics   Alcohol use: Yes    Comment: very  rarely   Drug use: Never   Allergies Shellfish allergy , Benzalkonium chloride, Pravastatin, Simvastatin, Bacitracin-polymyxin b, Fluoxetine hcl, Lisinopril, Neomycin-bacitracin zn-polymyx, Rosuvastatin , and Semaglutide   Review of Systems A thorough review of systems was obtained and all systems are negative except as noted in the HPI and PMH.   Physical Exam Vital Signs  I have reviewed the triage vital signs BP 122/62   Pulse 67   Temp (!) 97.5 F (36.4 C) (Oral)   Resp 17   Ht 5\' 8"  (1.727 m)   Wt 79 kg   SpO2 93%   BMI 26.48 kg/m  Physical Exam Vitals and nursing note reviewed.  Constitutional:      General: He is not in acute distress.    Appearance: Normal appearance. He is well-developed.  HENT:     Head: Normocephalic and atraumatic.     Right Ear: Tympanic membrane and external ear normal.     Left Ear: Tympanic membrane and external ear normal.     Mouth/Throat:     Mouth: Mucous membranes are moist.  Eyes:     General: No scleral icterus.    Extraocular Movements: Extraocular movements intact.     Conjunctiva/sclera: Conjunctivae normal.     Pupils: Pupils are equal, round, and reactive to light.     Comments: Horiz nystagmus left fatigable   Cardiovascular:     Rate and Rhythm: Normal rate and regular rhythm.     Pulses: Normal pulses.     Heart sounds: Normal heart sounds.  Pulmonary:     Effort: Pulmonary effort is normal. No respiratory distress.     Breath sounds: Normal breath sounds.  Abdominal:     General: Abdomen is flat.     Palpations: Abdomen is soft.     Tenderness: There is no abdominal tenderness.  Musculoskeletal:     Cervical back: No  rigidity.     Right lower leg: No edema.     Left lower leg: No edema.  Skin:    General: Skin is warm and dry.     Capillary Refill: Capillary refill takes less than 2 seconds.  Neurological:     Mental Status: He is alert and oriented to person, place, and time.     GCS: GCS eye subscore is 4. GCS  verbal subscore is 5. GCS motor subscore is 6.     Cranial Nerves: Cranial nerves 2-12 are intact. No facial asymmetry.     Sensory: Sensation is intact.     Motor: Motor function is intact. No tremor.     Coordination: Coordination is intact. Finger-Nose-Finger Test normal.     Comments: Strength 5/5 to BLUE/BLLE, equal and symmetric   HINTS exam c/w peripheral vertigo  Psychiatric:        Mood and Affect: Mood normal.        Behavior: Behavior normal.     ED Results and Treatments Labs (all labs ordered are listed, but only abnormal results are displayed) Labs Reviewed  CBC WITH DIFFERENTIAL/PLATELET - Abnormal; Notable for the following components:      Result Value   WBC 12.7 (*)    MCV 103.5 (*)    Neutro Abs 10.4 (*)    All other components within normal limits  COMPREHENSIVE METABOLIC PANEL WITH GFR - Abnormal; Notable for the following components:   Sodium 134 (*)    Glucose, Bld 218 (*)    All other components within normal limits  URINALYSIS, ROUTINE W REFLEX MICROSCOPIC - Abnormal; Notable for the following components:   Color, Urine STRAW (*)    Glucose, UA >=500 (*)    Ketones, ur 5 (*)    Protein, ur 30 (*)    All other components within normal limits  CBG MONITORING, ED - Abnormal; Notable for the following components:   Glucose-Capillary 173 (*)    All other components within normal limits  TROPONIN I (HIGH SENSITIVITY)                                                                                                                          Radiology MR BRAIN WO CONTRAST Result Date: 09/07/2023 CLINICAL DATA:  Vertigo EXAM: MRI HEAD WITHOUT CONTRAST TECHNIQUE: Multiplanar, multiecho pulse sequences of the brain and surrounding structures were obtained without intravenous contrast. COMPARISON:  None Available. FINDINGS: Brain: No acute infarct. No evidence of intracranial hemorrhage. T2/FLAIR hyperintensity in the periventricular and subcortical white matter.  Mild parenchymal volume loss. No edema, mass effect, or midline shift. Posterior fossa is unremarkable. Normal appearance of midline structures. The basilar cisterns are patent. No extra-axial fluid collections. Ventricles: Normal size and configuration of the ventricles. Vascular: Skull base flow voids are visualized. Skull and upper cervical spine: No focal abnormality. Sinuses/Orbits: Orbits are symmetric. Paranasal sinuses are clear. Other: Mastoid air cells are clear. IMPRESSION: No acute intracranial abnormality. Mild chronic  microvascular ischemic changes. Mild parenchymal volume loss. Electronically Signed   By: Denny Flack M.D.   On: 09/07/2023 15:12   DG Chest 2 View Result Date: 09/07/2023 CLINICAL DATA:  Chest pain. EXAM: CHEST - 2 VIEW COMPARISON:  None Available. FINDINGS: The heart size and mediastinal contours are within normal limits. Minimal bibasilar subsegmental atelectasis or scarring is noted. The visualized skeletal structures are unremarkable. IMPRESSION: Minimal bibasilar subsegmental atelectasis or scarring. Electronically Signed   By: Rosalene Colon M.D.   On: 09/07/2023 13:12    Pertinent labs & imaging results that were available during my care of the patient were reviewed by me and considered in my medical decision making (see MDM for details).  Medications Ordered in ED Medications  scopolamine (TRANSDERM-SCOP) 1 MG/3DAYS 1.5 mg (has no administration in time range)  meclizine (ANTIVERT) tablet 25 mg (25 mg Oral Given 09/07/23 1119)  metoCLOPramide (REGLAN) injection 5 mg (5 mg Intravenous Given 09/07/23 1310)  diphenhydrAMINE (BENADRYL) injection 25 mg (25 mg Intravenous Given 09/07/23 1308)  diazepam (VALIUM) tablet 5 mg (5 mg Oral Given 09/07/23 1518)                                                                                                                                     Procedures Procedures  (including critical care time)  Medical Decision Making /  ED Course    Medical Decision Making:    CLAUD GOWAN is a 72 y.o. male with past medical history as below, significant for COPD, HLD, HTN, vertigo, PTSD who presents to the ED with complaint of dizzy/spinning sensation. The complaint involves an extensive differential diagnosis and also carries with it a high risk of complications and morbidity.  Serious etiology was considered. Ddx includes but is not limited to: Differential includes all life-threatening causes for chest pain. This includes but is not exclusive to acute coronary syndrome, aortic dissection, pulmonary embolism, cardiac tamponade, community-acquired pneumonia, pericarditis, musculoskeletal chest wall pain, etc. in addition vertigo-  peripheral versus central, CVA, electrolyte derangement, disequilibrium, Mnire's disease, labyrinthitis, etc.   Complete initial physical exam performed, notably the patient was in no distress, no cp, currently dizzy.    Reviewed and confirmed nursing documentation for past medical history, family history, social history.  Vital signs reviewed.     Brief summary:  73 year old male history as above here with complaint of spinning sensation acute onset around 2 AM.  Also incidentally complained of chest pain intermittently over the past couple weeks.  Last chest pain was 2 days ago.  No current chest pain   Clinical Course as of 09/07/23 1627  Wed Sep 07, 2023  1454 Still symptomatic after antivert, benadryl, and reglan; will trial valium. MRI is pending [SG]  1518 MRI neg [SG]  1526 Assumed care. 73 yo M with vertigo that is MRI negative for stroke. Still unable to walk after meclizine, reglan, and valium.  [  RP]    Clinical Course User Index [RP] Ninetta Basket, MD [SG] Russella Courts A, DO     Pt with no relief after intervention, mri is neg. Symptoms persistent, unable to ambulate. Would recommend admission for persistent dizziness and likely would benefit from neuro  eval  Handoff dr Efraim Grange pending admit              Additional history obtained: -Additional history obtained from family -External records from outside source obtained and reviewed including: Chart review including previous notes, labs, imaging, consultation notes including  Home meds   Lab Tests: -I ordered, reviewed, and interpreted labs.   The pertinent results include:   Labs Reviewed  CBC WITH DIFFERENTIAL/PLATELET - Abnormal; Notable for the following components:      Result Value   WBC 12.7 (*)    MCV 103.5 (*)    Neutro Abs 10.4 (*)    All other components within normal limits  COMPREHENSIVE METABOLIC PANEL WITH GFR - Abnormal; Notable for the following components:   Sodium 134 (*)    Glucose, Bld 218 (*)    All other components within normal limits  URINALYSIS, ROUTINE W REFLEX MICROSCOPIC - Abnormal; Notable for the following components:   Color, Urine STRAW (*)    Glucose, UA >=500 (*)    Ketones, ur 5 (*)    Protein, ur 30 (*)    All other components within normal limits  CBG MONITORING, ED - Abnormal; Notable for the following components:   Glucose-Capillary 173 (*)    All other components within normal limits  TROPONIN I (HIGH SENSITIVITY)    Notable for labss table  EKG   EKG Interpretation Date/Time:  Wednesday Sep 07 2023 09:17:28 EDT Ventricular Rate:  70 PR Interval:  138 QRS Duration:  84 QT Interval:  416 QTC Calculation: 449 R Axis:   24  Text Interpretation: Normal sinus rhythm Normal ECG When compared with ECG of 10-Mar-2023 10:01, No significant change was found Confirmed by Russella Courts ([id number]) on 09/07/2023 10:03:17 AM         Imaging Studies ordered: I ordered imaging studies including mri brain I independently visualized the following imaging with scope of interpretation limited to determining acute life threatening conditions related to emergency care; findings noted above I agree with the radiologist interpretation If  any imaging was obtained with contrast I closely monitored patient for any possible adverse reaction a/w contrast administration in the emergency department   Medicines ordered and prescription drug management: Meds ordered this encounter  Medications   meclizine (ANTIVERT) tablet 25 mg   metoCLOPramide (REGLAN) injection 5 mg   diphenhydrAMINE (BENADRYL) injection 25 mg   diazepam (VALIUM) tablet 5 mg   scopolamine (TRANSDERM-SCOP) 1 MG/3DAYS 1.5 mg    -I have reviewed the patients home medicines and have made adjustments as needed   Consultations Obtained: na   Cardiac Monitoring: The patient was maintained on a cardiac monitor.  I personally viewed and interpreted the cardiac monitored which showed an underlying rhythm of: nsr Continuous pulse oximetry interpreted by myself, 99% on ra.    Social Determinants of Health:  Diagnosis or treatment significantly limited by social determinants of health: former smoker   Reevaluation: After the interventions noted above, I reevaluated the patient and found that they have stayed the same  Co morbidities that complicate the patient evaluation  Past Medical History:  Diagnosis Date   Arthritis    Cancer (HCC)    COPD (chronic obstructive  pulmonary disease) (HCC)    Coronary artery disease    Diabetes mellitus without complication (HCC)    Difficult intubation    Glidescope used [facility] 12/02/16, grade 3 view   Hyperlipidemia    Hypertension    PTSD (post-traumatic stress disorder)    Rheumatoid arthritis (HCC)    Vertigo       Dispostion: Disposition decision including need for hospitalization was considered, and patient disposition pending at time of sign out.    Final Clinical Impression(s) / ED Diagnoses Final diagnoses:  Dizziness        Teddi Favors, DO 09/07/23 1627

## 2023-09-08 DIAGNOSIS — I25118 Atherosclerotic heart disease of native coronary artery with other forms of angina pectoris: Secondary | ICD-10-CM

## 2023-09-08 DIAGNOSIS — E785 Hyperlipidemia, unspecified: Secondary | ICD-10-CM

## 2023-09-08 DIAGNOSIS — I10 Essential (primary) hypertension: Secondary | ICD-10-CM

## 2023-09-08 DIAGNOSIS — R42 Dizziness and giddiness: Secondary | ICD-10-CM | POA: Diagnosis not present

## 2023-09-08 LAB — BASIC METABOLIC PANEL WITH GFR
Anion gap: 9 (ref 5–15)
BUN: 28 mg/dL — ABNORMAL HIGH (ref 8–23)
CO2: 23 mmol/L (ref 22–32)
Calcium: 8.9 mg/dL (ref 8.9–10.3)
Chloride: 104 mmol/L (ref 98–111)
Creatinine, Ser: 0.76 mg/dL (ref 0.61–1.24)
GFR, Estimated: >60 mL/min (ref >60)
Glucose, Bld: 213 mg/dL — ABNORMAL HIGH (ref 70–99)
Potassium: 4.1 mmol/L (ref 3.5–5.1)
Sodium: 136 mmol/L (ref 135–145)

## 2023-09-08 LAB — CBC
HCT: 44.1 % (ref 39.0–52.0)
Hemoglobin: 14.1 g/dL (ref 13.0–17.0)
MCH: 32.8 pg (ref 26.0–34.0)
MCHC: 32 g/dL (ref 30.0–36.0)
MCV: 102.6 fL — ABNORMAL HIGH (ref 80.0–100.0)
Platelets: 262 10*3/uL (ref 150–400)
RBC: 4.3 MIL/uL (ref 4.22–5.81)
RDW: 14 % (ref 11.5–15.5)
WBC: 8.8 10*3/uL (ref 4.0–10.5)
nRBC: 0 % (ref 0.0–0.2)

## 2023-09-08 LAB — HEMOGLOBIN A1C
Hgb A1c MFr Bld: 10 % — ABNORMAL HIGH (ref 4.8–5.6)
Mean Plasma Glucose: 240.3 mg/dL

## 2023-09-08 LAB — GLUCOSE, CAPILLARY: Glucose-Capillary: 215 mg/dL — ABNORMAL HIGH (ref 70–99)

## 2023-09-08 MED ORDER — METHOCARBAMOL 750 MG PO TABS: 750.0000 mg | ORAL_TABLET | Freq: Three times a day (TID) | ORAL | Status: AC | PRN

## 2023-09-08 MED ORDER — MECLIZINE HCL 25 MG PO TABS: 25.0000 mg | ORAL_TABLET | Freq: Three times a day (TID) | ORAL | 0 refills | Status: AC | PRN

## 2023-09-08 MED ORDER — ROSUVASTATIN CALCIUM 5 MG PO TABS: 2.5000 mg | ORAL_TABLET | Freq: Every day | ORAL | Status: AC

## 2023-09-08 MED ORDER — SCOPOLAMINE 1 MG/3DAYS TD PT72: 1.0000 | MEDICATED_PATCH | TRANSDERMAL | 0 refills | Status: AC | PRN

## 2023-09-08 NOTE — Hospital Course (Signed)
 73 y.o. male, past medical history for rheumatoid arthritis, COPD, CAD, diabetes mellitus, hyperlipidemia, hypertension, vertigo (last event 4 to 5 years ago) reports usually respond to meclizine, he presents to ED secondary to sudden onset of head spinning, 2 AM this morning, provoked by head movement, however whenever he tried to ambulate, he has a chronic tinnitus, but it is at baseline, he denies any tingling, numbness. - In ED MRI brain with no acute findings, he received Valium, Reglan, scopolamine and meclizine, despite that remains with significant vertigo he could not ambulate, so [facility] hospitalist consulted to admit

## 2023-09-08 NOTE — Evaluation (Signed)
 Physical Therapy Evaluation Patient Details Name: Bryan Kent MRN: [id number] DOB: [birth year] Today's Date: 09/08/2023  History of Present Illness  Bryan Kent  is a 73 y.o. male, past medical history for rheumatoid arthritis, COPD, CAD, diabetes mellitus, hyperlipidemia, hypertension, vertigo (last event 4 to 5 years ago) reports usually respond to meclizine, he presents to ED secondary to sudden onset of head spinning, 2 AM this morning, provoked by head movement, however whenever he tried to ambulate, he has a chronic tinnitus, but it is at baseline, he denies any tingling, numbness.  - In ED MRI brain with no acute findings, he received Valium, Reglan, scopolamine and meclizine, despite that remains with significant vertigo he could not ambulate, so [facility] hospitalist consulted to admit   Clinical Impression  Patient positive for right sided up beating nystagmus with c/o increased vertigo when put into Right sided Dix-Hallpike maneuver, negative on the left side and treated with barbecue roll for right side with c/o of less dizziness, vertigo after wards. Patient limited for functional activities mostly due to c/o chronic back pain, able to ambulate in hallway safely using SPC, but limited mostly due to increasing vertigo symptoms.  Patient advised to take his dizziness medication per MD recommendations to reduce symptoms, possible falls and to follow-up in a physical therapy out patient clinic to be to assessed for vestibular rehab after not requiring use of meclizine for at least 3-4 days. PLAN:  Patient to be discharged home today and discharged from acute physical therapy to care of nursing for ambulation as tolerated for length of stay with recommendations stated below       If plan is discharge home, recommend the following: A little help with walking and/or transfers;A little help with bathing/dressing/bathroom;Help with stairs or ramp for entrance;Assistance with cooking/housework    Can travel by private vehicle        Equipment Recommendations None recommended by PT  Recommendations for Other Services       Functional Status Assessment Patient has had a recent decline in their functional status and demonstrates the ability to make significant improvements in function in a reasonable and predictable amount of time.     Precautions / Restrictions Precautions Precautions: Fall Recall of Precautions/Restrictions: Intact Restrictions Weight Bearing Restrictions Per Provider Order: No      Mobility  Bed Mobility   Bed Mobility: Supine to Sit, Rolling Rolling: Min assist   Supine to sit: Min assist     General bed mobility comments: limited mostly due to c/o increasing low back pain    Transfers Overall transfer level: Needs assistance Equipment used: Straight cane Transfers: Sit to/from Stand, Bed to chair/wheelchair/BSC Sit to Stand: Contact guard assist, Modified independent (Device/Increase time)   Step pivot transfers: Contact guard assist, Modified independent (Device/Increase time)       General transfer comment: slow labored movement, frequently has to lean on IV pole for support when using [facility]    Ambulation/Gait Ambulation/Gait assistance: Contact guard assist Gait Distance (Feet): 65 Feet Assistive device: 1 person hand held assist, Straight cane Gait Pattern/deviations: Decreased step length - right, Step-to pattern, Decreased step length - left, Decreased stance time - right, Decreased stride length Gait velocity: decreased     General Gait Details: slow labored movement with mostly step-to pattern using SPC, has to frequently lean on IV pole mostly due to c/o increased dizziness  Stairs            Wheelchair Mobility     Tilt  Bed    Modified Rankin (Stroke Patients Only)       Balance Overall balance assessment: Needs assistance Sitting-balance support: Feet supported, No upper extremity supported Sitting  balance-Leahy Scale: Fair Sitting balance - Comments: fair/good seated at EOB   Standing balance support: During functional activity, No upper extremity supported Standing balance-Leahy Scale: Poor Standing balance comment: fair/good using SPC                             Pertinent Vitals/Pain Pain Assessment Pain Assessment: Faces Faces Pain Scale: Hurts little more Pain Location: low back with movement Pain Descriptors / Indicators: Sore, Grimacing, Discomfort Pain Intervention(s): Limited activity within patient's tolerance, Monitored during session, Repositioned    Home Living Family/patient expects to be discharged to:: Private residence Living Arrangements: Alone Available Help at Discharge: Family;Available PRN/intermittently Type of Home: House Home Access: Level entry       Home Layout: One level Home Equipment: Agricultural consultant (2 wheels);Shower seat - built in;Cane - single point;Grab bars - tub/shower      Prior Function Prior Level of Function : Independent/Modified Independent;Driving             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     Extremity/Trunk Assessment   Upper Extremity Assessment Upper Extremity Assessment: Defer to OT evaluation    Lower Extremity Assessment Lower Extremity Assessment: Generalized weakness    Cervical / Trunk Assessment Cervical / Trunk Assessment: Kyphotic  Communication   Communication Communication: No apparent difficulties    Cognition Arousal: Alert Behavior During Therapy: WFL for tasks assessed/performed   PT - Cognitive impairments: No apparent impairments                         Following commands: Intact       Cueing Cueing Techniques: Verbal cues     General Comments      Exercises     Assessment/Plan    PT Assessment All further PT needs can be met in the next venue of care  PT Problem  List Decreased strength;Decreased activity tolerance;Decreased balance;Decreased mobility;Other (comment) (limited mostly due to Vertigo)       PT Treatment Interventions      PT Goals (Current goals can be found in the Care Plan section)  Acute Rehab PT Goals Patient Stated Goal: return home with family to assist PT Goal Formulation: With patient Time For Goal Achievement: 09/08/23 Potential to Achieve Goals: Good    Frequency       Co-evaluation PT/OT/SLP Co-Evaluation/Treatment: Yes Reason for Co-Treatment: To address functional/ADL transfers PT goals addressed during session: Mobility/safety with mobility;Balance;Proper use of DME OT goals addressed during session: ADL's and self-care       AM-PAC PT "6 Clicks" Mobility  Outcome Measure Help needed turning from your back to your side while in a flat bed without using bedrails?: None Help needed moving from lying on your back to sitting on the side of a flat bed without using bedrails?: A Little Help needed moving to and from a bed to a chair (including a wheelchair)?: A Little Help needed standing up from a chair using your arms (e.g., wheelchair or bedside chair)?: A Little Help needed to walk in hospital room?: A Little Help needed climbing 3-5 steps with a railing? : A Little 6 Click Score: 19    End of  Session   Activity Tolerance: Patient tolerated treatment well;Patient limited by fatigue;Other (comment) (limited by dizziness, vertigo) Patient left: in bed;with call bell/phone within reach Nurse Communication: Mobility status PT Visit Diagnosis: Unsteadiness on feet (R26.81);Other abnormalities of gait and mobility (R26.89);Muscle weakness (generalized) (M62.81)    Time: 0832-0909 PT Time Calculation (min) (ACUTE ONLY): 37 min   Charges:   PT Evaluation $PT Eval Moderate Complexity: 1 Mod PT Treatments $Therapeutic Activity: 8-22 mins $Canalith Rep Proc: 8-22 mins PT General Charges $$ ACUTE PT VISIT: 1  Visit         12:41 PM, 09/08/23 Walton Guppy, MPT Physical Therapist with [facility] [phone] office [phone] mobile phone

## 2023-09-08 NOTE — Discharge Instructions (Signed)
IMPORTANT INFORMATION: PAY CLOSE ATTENTION   PHYSICIAN DISCHARGE INSTRUCTIONS  Follow with Primary care provider  [facility]  and other consultants as instructed by your Hospitalist Physician  SEEK MEDICAL CARE OR RETURN TO EMERGENCY ROOM IF SYMPTOMS COME BACK, WORSEN OR NEW PROBLEM DEVELOPS   Please note: You were cared for by a hospitalist during your hospital stay. Every effort will be made to forward records to your primary care provider.  You can request that your primary care provider send for your hospital records if they have not received them.  Once you are discharged, your primary care physician will handle any further medical issues. Please note that NO REFILLS for any discharge medications will be authorized once you are discharged, as it is imperative that you return to your primary care physician (or establish a relationship with a primary care physician if you do not have one) for your post hospital discharge needs so that they can reassess your need for medications and monitor your lab values.  Please get a complete blood count and chemistry panel checked by your Primary MD at your next visit, and again as instructed by your Primary MD.  Get Medicines reviewed and adjusted: Please take all your medications with you for your next visit with your Primary MD  Laboratory/radiological data: Please request your Primary MD to go over all hospital tests and procedure/radiological results at the follow up, please ask your primary care provider to get all Hospital records sent to his/her office.  In some cases, they will be blood work, cultures and biopsy results pending at the time of your discharge. Please request that your primary care provider follow up on these results.  If you are diabetic, please bring your blood sugar readings with you to your follow up appointment with primary care.    Please call and make your follow up appointments as soon as possible.    Also  Note the following: If you experience worsening of your admission symptoms, develop shortness of breath, life threatening emergency, suicidal or homicidal thoughts you must seek medical attention immediately by calling 911 or calling your MD immediately  if symptoms less severe.  You must read complete instructions/literature along with all the possible adverse reactions/side effects for all the Medicines you take and that have been prescribed to you. Take any new Medicines after you have completely understood and accpet all the possible adverse reactions/side effects.   Do not drive when taking Pain medications or sleeping medications (Benzodiazepines)  Do not take more than prescribed Pain, Sleep and Anxiety Medications. It is not advisable to combine anxiety,sleep and pain medications without talking with your primary care practitioner  Special Instructions: If you have smoked or chewed Tobacco  in the last 2 yrs please stop smoking, stop any regular Alcohol  and or any Recreational drug use.  Wear Seat belts while driving.  Do not drive if taking any narcotic, mind altering or controlled substances or recreational drugs or alcohol.       

## 2023-09-08 NOTE — TOC CM/SW Note (Addendum)
 Transition of Care [facility]) - Inpatient Brief Assessment   Patient Details  Name: Bryan Kent MRN: [id number] Date of Birth: June 23, 1950  [facility] CM/SW Contact:    Grandville Lax, LCSWA Phone Number: 09/08/2023, 9:01 AM   Clinical Narrative: [facility] notified [facility]. [facility] notification ID is [id number]  CSW updated that PT is recommending OP PT for pt at D/C. CSW spoke with pt about referral and he is agreeable to this. CSW to make referral. TOC signing off.   Transition of Care Asessment: Insurance and Status: Insurance coverage has been reviewed Patient has primary care physician: Yes Home environment has been reviewed: From home Prior level of function:: Independent Prior/Current Home Services: No current home services Social Drivers of Health Review: SDOH reviewed no interventions necessary Readmission risk has been reviewed: Yes Transition of care needs: no transition of care needs at this time

## 2023-09-08 NOTE — Evaluation (Signed)
 Occupational Therapy Evaluation Patient Details Name: Bryan Kent MRN: [id number] DOB: [birth year] Today's Date: 09/08/2023   History of Present Illness   Bryan Kent  is a 73 y.o. male, past medical history for rheumatoid arthritis, COPD, CAD, diabetes mellitus, hyperlipidemia, hypertension, vertigo (last event 4 to 5 years ago) reports usually respond to meclizine, he presents to ED secondary to sudden onset of head spinning, 2 AM this morning, provoked by head movement, however whenever he tried to ambulate, he has a chronic tinnitus, but it is at baseline, he denies any tingling, numbness.  - In ED MRI brain with no acute findings, he received Valium, Reglan, scopolamine and meclizine, despite that remains with significant vertigo he could not ambulate, so [facility] hospitalist consulted to admit (per MD)     Clinical Impressions Pt agreeable to OT and PT co-evaluation. Pt primary deficit appears to be related to dizziness. See PT note for information on vestibular assessment and treatment. Min A at times for bed mobility during vestibular tasks. More mod I for simple supine to sit. CGA at times when ambulating with cane due to pt feeling dizzy. Deferring to OT for vestibular treatment. Pt is not recommended for further acute OT services and will be discharged to care of nursing staff for remaining length of stay.      If plan is discharge home, recommend the following:   A little help with walking and/or transfers;Help with stairs or ramp for entrance     Functional Status Assessment   Patient has not had a recent decline in their functional status     Equipment Recommendations   None recommended by OT             Precautions/Restrictions   Precautions Precautions: Fall Recall of Precautions/Restrictions: Intact Restrictions Weight Bearing Restrictions Per Provider Order: No     Mobility Bed Mobility Overal bed mobility: Needs Assistance Bed Mobility: Supine  to Sit, Rolling Rolling: Min assist   Supine to sit: Min assist     General bed mobility comments: Min for mobility during vestibular testing. Able to complete bed mobility without assist under normal conditions.    Transfers Overall transfer level: Needs assistance Equipment used: Straight cane Transfers: Sit to/from Stand, Bed to chair/wheelchair/BSC Sit to Stand: Contact guard assist, Modified independent (Device/Increase time)     Step pivot transfers: Contact guard assist, Modified independent (Device/Increase time)     General transfer comment: CGA intermitently due to dizziness      Balance Overall balance assessment: Mild deficits observed, not formally tested                                         ADL either performed or assessed with clinical judgement   ADL Overall ADL's : Needs assistance/impaired                                       General ADL Comments: Mod I to CGA for standing ADL's with use of cane or RW based on unsteady gait that seemed to be a result of dizziness. Good ability to complete seated ADL's.     Vision Baseline Vision/History: 1 Wears glasses Ability to See in Adequate Light: 1 Impaired Patient Visual Report: No change from baseline Vision Assessment?: No apparent visual deficits  Perception Perception: Not tested       Praxis Praxis: Not tested       Pertinent Vitals/Pain Pain Assessment Pain Assessment: No/denies pain     Extremity/Trunk Assessment Upper Extremity Assessment Upper Extremity Assessment: Overall WFL for tasks assessed   Lower Extremity Assessment Lower Extremity Assessment: Defer to PT evaluation   Cervical / Trunk Assessment Cervical / Trunk Assessment: Kyphotic   Communication Communication Communication: No apparent difficulties   Cognition Arousal: Alert Behavior During Therapy: WFL for tasks assessed/performed Cognition: No apparent impairments                                Following commands: Intact       Cueing  General Comments   Cueing Techniques: Verbal cues                 Home Living Family/patient expects to be discharged to:: Private residence Living Arrangements: Alone Available Help at Discharge: Family;Available PRN/intermittently Type of Home: House Home Access: Level entry     Home Layout: One level     Bathroom Shower/Tub: Producer, television/film/video: Standard Bathroom Accessibility: Yes How Accessible: Accessible via walker Home Equipment: Rolling Walker (2 wheels);Shower seat - built in;Cane - single point;Grab bars - tub/shower          Prior Functioning/Environment Prior Level of Function : Independent/Modified Independent             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                            Co-evaluation PT/OT/SLP Co-Evaluation/Treatment: Yes Reason for Co-Treatment: To address functional/ADL transfers   OT goals addressed during session: ADL's and self-care      AM-PAC OT "6 Clicks" Daily Activity     Outcome Measure Help from another person eating meals?: None Help from another person taking care of personal grooming?: A Little Help from another person toileting, which includes using toliet, bedpan, or urinal?: A Little Help from another person bathing (including washing, rinsing, drying)?: None Help from another person to put on and taking off regular upper body clothing?: None Help from another person to put on and taking off regular lower body clothing?: None 6 Click Score: 22   End of Session Equipment Utilized During Treatment:  (cane)  Activity Tolerance: Patient tolerated treatment well Patient left: in bed;with call bell/phone within reach  OT Visit Diagnosis: Unsteadiness on feet (R26.81);Other abnormalities of gait and mobility (R26.89);Dizziness and  giddiness (R42)                Time: 1610-9604 OT Time Calculation (min): 36 min Charges:  OT General Charges $OT Visit: 1 Visit OT Evaluation $OT Eval Low Complexity: 1 Low  Bryan Kent OT, MOT   Thurnell Floss 09/08/2023, 11:55 AM

## 2023-09-08 NOTE — Plan of Care (Signed)

## 2023-09-08 NOTE — Discharge Summary (Signed)
 Physician Discharge Summary  JAHAD OLD MVH:[id number] DOB: [birth year] DOA: 09/07/2023  PCP: [facility]  Admit date: 09/07/2023 Discharge date: 09/08/2023  Admitted From:  Home  Disposition: Home   Recommendations for Outpatient Follow-up:  Follow up with PCP in 5-7 days for recheck   Home Health:  Outpatient PT   Discharge Condition: STABLE   CODE STATUS: FULL DIET: heart healthy foods recommended    Brief Hospitalization Summary: Please see all hospital notes, images, labs for full details of the hospitalization. Admission provider HPI:   73 y.o. male, past medical history for rheumatoid arthritis, COPD, CAD, diabetes mellitus, hyperlipidemia, hypertension, vertigo (last event 4 to 5 years ago) reports usually respond to meclizine, he presents to ED secondary to sudden onset of head spinning, 2 AM this morning, provoked by head movement, however whenever he tried to ambulate, he has a chronic tinnitus, but it is at baseline, he denies any tingling, numbness. - In ED MRI brain with no acute findings, he received Valium, Reglan, scopolamine and meclizine, despite that remains with significant vertigo he could not ambulate, so [facility] hospitalist consulted to admit  Hospital Course  Pt was placed in observation due to persistent symptoms of dizziness/vertigo which was treated in the ED but persisted.  His MRI brain did not show any acute findings and was reassuring.  He was treated with a scopolamine patch and he was observed in the hospital and was seen by PT/OT for vestibular PT evaluation.  They have evaluated him and recommended for him to have outpatient PT which has been arranged by social worker thru [place].  He is feeling better now and will discharge home with close outpatient follow up recommended with his providers at the [place].  He says he could be referred to a neurologist in the [place] by his PCP.  He prefers to keep his care with the [place].  Pt is discharging home in stable  condition.    Discharge Diagnoses:  Principal Problem:   Vertigo Active Problems:   CAD (coronary artery disease)   Hypertension   Hyperlipidemia   Type 2 diabetes mellitus with complication, without long-term current use of insulin  (HCC)   COPD (chronic obstructive pulmonary disease) (HCC)   Discharge Instructions: Discharge Instructions     Ambulatory referral to Physical Therapy   Complete by: As directed       Allergies as of 09/08/2023       Reactions   Shellfish Allergy  Anaphylaxis, Hives   Lobster**   Benzalkonium Chloride Rash   Pravastatin Rash   Simvastatin Rash   Bacitracin-polymyxin B Dermatitis   Fluoxetine Hcl Hives, Dermatitis   Lisinopril Cough   Neomycin-bacitracin Zn-polymyx Dermatitis   Neosporin ointment   Rosuvastatin  Other (See Comments)   Cramp, Headache   Semaglutide  Nausea And Vomiting, Other (See Comments)   Constipation, Weight loss        Medication List     TAKE these medications    albuterol  0.63 MG/3ML nebulizer solution Commonly known as: ACCUNEB  Take 1 ampule by nebulization in the morning and at bedtime.   arformoterol  15 MCG/2ML Nebu Commonly known as: BROVANA  Take 15 mcg by nebulization 2 (two) times daily.   aspirin  EC 81 MG tablet Take 81 mg by mouth daily. Swallow whole.   azithromycin  250 MG tablet Commonly known as: ZITHROMAX  Take 250 mg by mouth every Monday, Wednesday, and Friday.   budesonide-formoterol  80-4.5 MCG/ACT inhaler Commonly known as: SYMBICORT Inhale 1 puff into the lungs in the  morning and at bedtime.   empagliflozin  25 MG Tabs tablet Commonly known as: JARDIANCE  Take 25 mg by mouth daily.   folic acid 1 MG tablet Commonly known as: FOLVITE Take 1 mg by mouth daily.   glipiZIDE  10 MG tablet Commonly known as: GLUCOTROL  Take 10 mg by mouth daily as needed (high bs).   glyBURIDE-metformin  1.25-250 MG tablet Commonly known as: GLUCOVANCE Take 1 tablet by mouth 2 (two) times daily with a  meal.   hydroxychloroquine  200 MG tablet Commonly known as: PLAQUENIL  Take 200 mg by mouth daily.   Ipratropium-Albuterol  20-100 MCG/ACT Aers respimat Commonly known as: COMBIVENT Inhale 1 puff into the lungs every 6 (six) hours as needed for wheezing or shortness of breath.   meclizine 25 MG tablet Commonly known as: ANTIVERT Take 1 tablet (25 mg total) by mouth 3 (three) times daily as needed for dizziness.   methocarbamol  750 MG tablet Commonly known as: Robaxin -750 Take 1 tablet (750 mg total) by mouth every 8 (eight) hours as needed for muscle spasms.   methotrexate  2.5 MG tablet Commonly known as: RHEUMATREX Take 20 mg by mouth every Monday.   nitroGLYCERIN  0.4 MG SL tablet Commonly known as: NITROSTAT  PLACE 1 TABLET (0.4 MG TOTAL) UNDER THE TONGUE EVERY FIVE MINUTES AS NEEDED FOR CHEST PAIN.   pioglitazone  30 MG tablet Commonly known as: ACTOS  Take 30 mg by mouth daily.   predniSONE  5 MG tablet Commonly known as: DELTASONE  Take 5 mg by mouth daily with breakfast.   propranolol  10 MG tablet Commonly known as: INDERAL  Take 10 mg by mouth 2 (two) times daily as needed (anxiety/PTSD).   rosuvastatin  5 MG tablet Commonly known as: Crestor  Take 0.5 tablets (2.5 mg total) by mouth daily.   scopolamine 1 MG/3DAYS Commonly known as: TRANSDERM-SCOP Place 1 patch (1.5 mg total) onto the skin every 3 (three) days as needed (dizziness).        Follow-up Information     [facility] Schedule an appointment as soon as possible for a visit in 5 day(s).   Why: Hospital Follow Up Contact information: [address] [facility] [place] [address] [phone]                Allergies  Allergen Reactions   Shellfish Allergy  Anaphylaxis and Hives    Lobster**   Benzalkonium Chloride Rash   Pravastatin Rash   Simvastatin Rash   Bacitracin-Polymyxin B Dermatitis   Fluoxetine Hcl Hives and Dermatitis   Lisinopril Cough    Neomycin-Bacitracin Zn-Polymyx Dermatitis    Neosporin ointment   Rosuvastatin  Other (See Comments)    Cramp, Headache   Semaglutide  Nausea And Vomiting and Other (See Comments)    Constipation, Weight loss   Allergies as of 09/08/2023       Reactions   Shellfish Allergy  Anaphylaxis, Hives   Lobster**   Benzalkonium Chloride Rash   Pravastatin Rash   Simvastatin Rash   Bacitracin-polymyxin B Dermatitis   Fluoxetine Hcl Hives, Dermatitis   Lisinopril Cough   Neomycin-bacitracin Zn-polymyx Dermatitis   Neosporin ointment   Rosuvastatin  Other (See Comments)   Cramp, Headache   Semaglutide  Nausea And Vomiting, Other (See Comments)   Constipation, Weight loss        Medication List     TAKE these medications    albuterol  0.63 MG/3ML nebulizer solution Commonly known as: ACCUNEB  Take 1 ampule by nebulization in the morning and at bedtime.   arformoterol  15 MCG/2ML Nebu Commonly known as: BROVANA  Take 15  mcg by nebulization 2 (two) times daily.   aspirin  EC 81 MG tablet Take 81 mg by mouth daily. Swallow whole.   azithromycin  250 MG tablet Commonly known as: ZITHROMAX  Take 250 mg by mouth every Monday, Wednesday, and Friday.   budesonide-formoterol  80-4.5 MCG/ACT inhaler Commonly known as: SYMBICORT Inhale 1 puff into the lungs in the morning and at bedtime.   empagliflozin  25 MG Tabs tablet Commonly known as: JARDIANCE  Take 25 mg by mouth daily.   folic acid 1 MG tablet Commonly known as: FOLVITE Take 1 mg by mouth daily.   glipiZIDE  10 MG tablet Commonly known as: GLUCOTROL  Take 10 mg by mouth daily as needed (high bs).   glyBURIDE-metformin  1.25-250 MG tablet Commonly known as: GLUCOVANCE Take 1 tablet by mouth 2 (two) times daily with a meal.   hydroxychloroquine  200 MG tablet Commonly known as: PLAQUENIL  Take 200 mg by mouth daily.   Ipratropium-Albuterol  20-100 MCG/ACT Aers respimat Commonly known as: COMBIVENT Inhale 1 puff into the lungs every  6 (six) hours as needed for wheezing or shortness of breath.   meclizine 25 MG tablet Commonly known as: ANTIVERT Take 1 tablet (25 mg total) by mouth 3 (three) times daily as needed for dizziness.   methocarbamol  750 MG tablet Commonly known as: Robaxin -750 Take 1 tablet (750 mg total) by mouth every 8 (eight) hours as needed for muscle spasms.   methotrexate  2.5 MG tablet Commonly known as: RHEUMATREX Take 20 mg by mouth every Monday.   nitroGLYCERIN  0.4 MG SL tablet Commonly known as: NITROSTAT  PLACE 1 TABLET (0.4 MG TOTAL) UNDER THE TONGUE EVERY FIVE MINUTES AS NEEDED FOR CHEST PAIN.   pioglitazone  30 MG tablet Commonly known as: ACTOS  Take 30 mg by mouth daily.   predniSONE  5 MG tablet Commonly known as: DELTASONE  Take 5 mg by mouth daily with breakfast.   propranolol  10 MG tablet Commonly known as: INDERAL  Take 10 mg by mouth 2 (two) times daily as needed (anxiety/PTSD).   rosuvastatin  5 MG tablet Commonly known as: Crestor  Take 0.5 tablets (2.5 mg total) by mouth daily.   scopolamine 1 MG/3DAYS Commonly known as: TRANSDERM-SCOP Place 1 patch (1.5 mg total) onto the skin every 3 (three) days as needed (dizziness).        Procedures/Studies: MR BRAIN WO CONTRAST Result Date: 09/07/2023 CLINICAL DATA:  Vertigo EXAM: MRI HEAD WITHOUT CONTRAST TECHNIQUE: Multiplanar, multiecho pulse sequences of the brain and surrounding structures were obtained without intravenous contrast. COMPARISON:  None Available. FINDINGS: Brain: No acute infarct. No evidence of intracranial hemorrhage. T2/FLAIR hyperintensity in the periventricular and subcortical white matter. Mild parenchymal volume loss. No edema, mass effect, or midline shift. Posterior fossa is unremarkable. Normal appearance of midline structures. The basilar cisterns are patent. No extra-axial fluid collections. Ventricles: Normal size and configuration of the ventricles. Vascular: Skull base flow voids are visualized.  Skull and upper cervical spine: No focal abnormality. Sinuses/Orbits: Orbits are symmetric. Paranasal sinuses are clear. Other: Mastoid air cells are clear. IMPRESSION: No acute intracranial abnormality. Mild chronic microvascular ischemic changes. Mild parenchymal volume loss. Electronically Signed   By: Denny Flack M.D.   On: 09/07/2023 15:12   DG Chest 2 View Result Date: 09/07/2023 CLINICAL DATA:  Chest pain. EXAM: CHEST - 2 VIEW COMPARISON:  None Available. FINDINGS: The heart size and mediastinal contours are within normal limits. Minimal bibasilar subsegmental atelectasis or scarring is noted. The visualized skeletal structures are unremarkable. IMPRESSION: Minimal bibasilar subsegmental atelectasis or scarring. Electronically Signed  By: Rosalene Colon M.D.   On: 09/07/2023 13:12     Subjective: Pt reports his symptoms are much improved, says he will be able to follow up with a neurologist at [facility] where he receives most of his medical care.  He is agreeable to outpatient PT arranged thru [place].  He has no other complaints.   Discharge Exam: Vitals:   09/08/23 0357 09/08/23 0805  BP: 107/63 119/76  Pulse: 77 88  Resp: (!) 21 18  Temp: (!) 97.5 F (36.4 C) 98.2 F (36.8 C)  SpO2: 93% 95%   Vitals:   09/07/23 2028 09/07/23 2354 09/08/23 0357 09/08/23 0805  BP: 137/78 (!) 115/56 107/63 119/76  Pulse: 81 72 77 88  Resp: 15 18 (!) 21 18  Temp: 98.2 F (36.8 C) 98 F (36.7 C) (!) 97.5 F (36.4 C) 98.2 F (36.8 C)  TempSrc: Oral Oral Oral Oral  SpO2: 93% 94% 93% 95%  Weight:      Height:       General: Pt is alert, awake, not in acute distress Cardiovascular: normal S1/S2 +, no rubs, no gallops Respiratory: CTA bilaterally, no wheezing, no rhonchi Abdominal: Soft, NT, ND, bowel sounds + Extremities: no edema, no cyanosis Neurological: nonfocal exam.    The results of significant diagnostics from this hospitalization (including imaging, microbiology, ancillary and laboratory)  are listed below for reference.     Microbiology: No results found for this or any previous visit (from the past 240 hours).   Labs: BNP (last 3 results) No results for input(s): "BNP" in the last 8760 hours. Basic Metabolic Panel: Recent Labs  Lab 09/07/23 1045 09/08/23 0440  NA 134* 136  K 4.1 4.1  CL 100 104  CO2 25 23  GLUCOSE 218* 213*  BUN 21 28*  CREATININE 0.72 0.76  CALCIUM  8.9 8.9   Liver Function Tests: Recent Labs  Lab 09/07/23 1045  AST 16  ALT 23  ALKPHOS 75  BILITOT 0.6  PROT 7.2  ALBUMIN 3.9   No results for input(s): "LIPASE", "AMYLASE" in the last 168 hours. No results for input(s): "AMMONIA" in the last 168 hours. CBC: Recent Labs  Lab 09/07/23 1045 09/08/23 0440  WBC 12.7* 8.8  NEUTROABS 10.4*  --   HGB 14.3 14.1  HCT 43.8 44.1  MCV 103.5* 102.6*  PLT 247 262   Cardiac Enzymes: No results for input(s): "CKTOTAL", "CKMB", "CKMBINDEX", "TROPONINI" in the last 168 hours. BNP: Invalid input(s): "POCBNP" CBG: Recent Labs  Lab 09/07/23 1354 09/07/23 2144 09/08/23 0755  GLUCAP 173* 251* 215*   D-Dimer No results for input(s): "DDIMER" in the last 72 hours. Hgb A1c Recent Labs    09/08/23 0440  HGBA1C 10.0*   Lipid Profile No results for input(s): "CHOL", "HDL", "LDLCALC", "TRIG", "CHOLHDL", "LDLDIRECT" in the last 72 hours. Thyroid function studies No results for input(s): "TSH", "T4TOTAL", "T3FREE", "THYROIDAB" in the last 72 hours.  Invalid input(s): "FREET3" Anemia work up No results for input(s): "VITAMINB12", "FOLATE", "FERRITIN", "TIBC", "IRON", "RETICCTPCT" in the last 72 hours. Urinalysis    Component Value Date/Time   COLORURINE STRAW (A) 09/07/2023 1455   APPEARANCEUR CLEAR 09/07/2023 1455   LABSPEC 1.020 09/07/2023 1455   PHURINE 6.0 09/07/2023 1455   GLUCOSEU >=500 (A) 09/07/2023 1455   HGBUR NEGATIVE 09/07/2023 1455   BILIRUBINUR NEGATIVE 09/07/2023 1455   KETONESUR 5 (A) 09/07/2023 1455   PROTEINUR 30  (A) 09/07/2023 1455   NITRITE NEGATIVE 09/07/2023 1455   LEUKOCYTESUR NEGATIVE  09/07/2023 1455   Sepsis Labs Recent Labs  Lab 09/07/23 1045 09/08/23 0440  WBC 12.7* 8.8   Microbiology No results found for this or any previous visit (from the past 240 hours).  Time coordinating discharge:  36 mins   SIGNED:  Faustino Hook, MD  [facility] 09/08/2023, 11:10 AM How to contact the [facility] Attending or Consulting provider 7A - 7P or covering provider during after hours 7P -7A, for this patient?  Check the care team in [facility] and look for a) attending/consulting [facility] provider listed and b) the [facility] team listed Log into [email or web address] and use [place]'s universal password to access. If you do not have the password, please contact the hospital operator. Locate the [facility] provider you are looking for under [facility] and page to a number that you can be directly reached. If you still have difficulty reaching the provider, please page the [facility] (Director on Call) for the Hospitalists listed on amion for assistance.
